# Patient Record
Sex: Female | Born: 1960 | Race: Black or African American | Hispanic: No | Smoking: Former smoker
Health system: Southern US, Community
[De-identification: ages and names within clinical notes are randomized; demographics above are authoritative.]

## PROBLEM LIST (undated history)

## (undated) DIAGNOSIS — J45909 Unspecified asthma, uncomplicated: Secondary | ICD-10-CM

## (undated) DIAGNOSIS — I10 Essential (primary) hypertension: Secondary | ICD-10-CM

## (undated) DIAGNOSIS — T7840XA Allergy, unspecified, initial encounter: Secondary | ICD-10-CM

## (undated) HISTORY — DX: Essential (primary) hypertension: I10

## (undated) HISTORY — PX: COLONOSCOPY WITH PROPOFOL: SHX5780

## (undated) HISTORY — DX: Unspecified asthma, uncomplicated: J45.909

## (undated) HISTORY — PX: CYST EXCISION: SHX5701

## (undated) HISTORY — DX: Allergy, unspecified, initial encounter: T78.40XA

---

## 2016-05-05 ENCOUNTER — Encounter: Payer: Self-pay | Admitting: Pharmacy Technician

## 2016-05-08 ENCOUNTER — Telehealth: Payer: Self-pay

## 2016-05-08 NOTE — Telephone Encounter (Signed)
Called pt and went over eligibility with her. PT verbalized understanding.

## 2016-05-26 ENCOUNTER — Encounter (INDEPENDENT_AMBULATORY_CARE_PROVIDER_SITE_OTHER): Payer: Self-pay

## 2016-05-26 ENCOUNTER — Ambulatory Visit: Payer: Self-pay | Admitting: Pharmacy Technician

## 2016-05-26 DIAGNOSIS — Z79899 Other long term (current) drug therapy: Secondary | ICD-10-CM

## 2016-05-26 NOTE — Progress Notes (Signed)
Completed [facility] application and contract.  Patient agreed to all terms of the [facility] contract.  Patient to provide notarized letter of support and letter from spouse indicating that they are separated and he is not living in the household.  Provided patient with community resource material based on her particular needs.    Patient has an appointment on Jun 19, 2016 with [facility].  She indicated that she would like to be referred to [facility].  Sending a note to [facility] to make aware.    [name] Care Manager [facility]

## 2016-06-05 ENCOUNTER — Ambulatory Visit: Payer: Self-pay

## 2016-06-10 ENCOUNTER — Ambulatory Visit: Payer: Self-pay | Admitting: Adult Health Nurse Practitioner

## 2016-06-10 VITALS — BP 135/84 | HR 92 | Temp 98.1°F | Wt 264.8 lb

## 2016-06-10 DIAGNOSIS — I10 Essential (primary) hypertension: Secondary | ICD-10-CM

## 2016-06-10 DIAGNOSIS — Z0000 Encounter for general adult medical examination without abnormal findings: Secondary | ICD-10-CM | POA: Insufficient documentation

## 2016-06-10 LAB — GLUCOSE, POCT (MANUAL RESULT ENTRY): POC GLUCOSE: 85 mg/dL (ref 70–99)

## 2016-06-10 MED ORDER — LOSARTAN POTASSIUM-HCTZ 100-25 MG PO TABS: 1.0000 | ORAL_TABLET | Freq: Every day | ORAL | 0 refills | Status: DC

## 2016-06-10 MED ORDER — ALBUTEROL SULFATE HFA 108 (90 BASE) MCG/ACT IN AERS: 2.0000 | INHALATION_SPRAY | Freq: Four times a day (QID) | RESPIRATORY_TRACT | 3 refills | Status: DC | PRN

## 2016-06-10 NOTE — Progress Notes (Signed)
  Patient: Crystal Hays Female    DOB: 04-13-1960   56 y.o.   MRN: [id number] Visit Date: 06/10/2016  Today's Provider: Jacelyn Pi, NP   Chief Complaint  Patient presents with  . Medication Refill  . Eczema   Subjective:    HPI     HTN:  Taking medication as directed.  Endorses low sodium diet, no exercise.   Pt states that she has eczema flares.      No Known Allergies Previous Medications   ALBUTEROL (PROVENTIL HFA;VENTOLIN HFA) 108 (90 BASE) MCG/ACT INHALER    Inhale 2 puffs into the lungs every 6 (six) hours as needed for wheezing or shortness of breath.   BECLOMETHASONE (QVAR) 40 MCG/ACT INHALER    Inhale 2 puffs into the lungs 2 (two) times daily.   LOSARTAN-HYDROCHLOROTHIAZIDE (HYZAAR) 100-25 MG TABLET    Take 1 tablet by mouth daily.    Review of Systems  All other systems reviewed and are negative.   Social History  Substance Use Topics  . Smoking status: Former Smoker    Types: Cigarettes    Quit date: 2004  . Smokeless tobacco: Never Used     Comment: quit  . Alcohol use Not on file   Objective:   BP 135/84   Pulse 92   Temp 98.1 F (36.7 C)   Wt 264 lb 12.8 oz (120.1 kg)   LMP  (Approximate) Comment: menopause  Physical Exam  Constitutional: She is oriented to person, place, and time. She appears well-developed and well-nourished.  HENT:  Head: Normocephalic and atraumatic.  Eyes: Pupils are equal, round, and reactive to light.  Neck: Normal range of motion. Neck supple. No thyromegaly present.  Cardiovascular: Normal rate, regular rhythm and normal heart sounds.   Pulmonary/Chest: Effort normal and breath sounds normal.  Abdominal: Soft. Bowel sounds are normal.  Neurological: She is alert and oriented to person, place, and time.  Skin: Skin is warm and dry.  Psychiatric: She has a normal mood and affect. Her behavior is normal.  Vitals reviewed.       Assessment & Plan:           Eczema:  Given some OTC remedies to help.    HTN:  Borderline.  Goal BP <140/80.  Continue current medication regimen.  Encourage low salt diet and exercise.   Routine labs.     Jacelyn Pi, NP   [facility]

## 2016-06-10 NOTE — Addendum Note (Signed)
Addended by: Smiley Houseman D on: 06/10/2016 08:26 PM   Modules accepted: Orders

## 2016-06-11 LAB — COMPREHENSIVE METABOLIC PANEL
A/G RATIO: 1.3 (ref 1.2–2.2)
ALBUMIN: 4.2 g/dL (ref 3.5–5.5)
ALK PHOS: 72 IU/L (ref 39–117)
ALT: 13 IU/L (ref 0–32)
AST: 14 IU/L (ref 0–40)
BILIRUBIN TOTAL: 0.3 mg/dL (ref 0.0–1.2)
BUN / CREAT RATIO: 29 — AB (ref 9–23)
BUN: 19 mg/dL (ref 6–24)
CO2: 28 mmol/L (ref 18–29)
Calcium: 9.5 mg/dL (ref 8.7–10.2)
Chloride: 95 mmol/L — ABNORMAL LOW (ref 96–106)
Creatinine, Ser: 0.66 mg/dL (ref 0.57–1.00)
GFR calc Af Amer: 114 mL/min/{1.73_m2} (ref >59)
GFR calc non Af Amer: 99 mL/min/{1.73_m2} (ref >59)
GLOBULIN, TOTAL: 3.3 g/dL (ref 1.5–4.5)
GLUCOSE: 98 mg/dL (ref 65–99)
POTASSIUM: 3.8 mmol/L (ref 3.5–5.2)
SODIUM: 142 mmol/L (ref 134–144)
Total Protein: 7.5 g/dL (ref 6.0–8.5)

## 2016-06-11 LAB — CBC
HEMOGLOBIN: 13.6 g/dL (ref 11.1–15.9)
Hematocrit: 40.6 % (ref 34.0–46.6)
MCH: 28.7 pg (ref 26.6–33.0)
MCHC: 33.5 g/dL (ref 31.5–35.7)
MCV: 86 fL (ref 79–97)
Platelets: 268 10*3/uL (ref 150–379)
RBC: 4.74 x10E6/uL (ref 3.77–5.28)
RDW: 14.4 % (ref 12.3–15.4)
WBC: 8.3 10*3/uL (ref 3.4–10.8)

## 2016-06-11 LAB — HEMOGLOBIN A1C
ESTIMATED AVERAGE GLUCOSE: 117 mg/dL
HEMOGLOBIN A1C: 5.7 % — AB (ref 4.8–5.6)

## 2016-06-11 LAB — TSH: TSH: 0.746 u[IU]/mL (ref 0.450–4.500)

## 2016-06-19 ENCOUNTER — Telehealth: Payer: Self-pay | Admitting: Pharmacy Technician

## 2016-06-19 NOTE — Telephone Encounter (Signed)
Received updated proof of income.  Patient eligible to receive medication assistance at [facility] until 41/19 as long as eligibility requirements continue to be met.  [facility]

## 2016-06-24 ENCOUNTER — Ambulatory Visit: Payer: Self-pay | Admitting: Adult Health Nurse Practitioner

## 2016-06-24 VITALS — BP 132/89 | HR 88 | Temp 98.2°F | Wt 266.5 lb

## 2016-06-24 DIAGNOSIS — I10 Essential (primary) hypertension: Secondary | ICD-10-CM

## 2016-06-24 DIAGNOSIS — R7303 Prediabetes: Secondary | ICD-10-CM | POA: Insufficient documentation

## 2016-06-24 NOTE — Patient Instructions (Signed)
Prediabetes Eating Plan Prediabetes-also called impaired glucose tolerance or impaired fasting glucose-is a condition that causes blood sugar (blood glucose) levels to be higher than normal. Following a healthy diet can help to keep prediabetes under control. It can also help to lower the risk of type 2 diabetes and heart disease, which are increased in people who have prediabetes. Along with regular exercise, a healthy diet:  Promotes weight loss.  Helps to control blood sugar levels.  Helps to improve the way that the body uses insulin.  What do I need to know about this eating plan?  Use the glycemic index (GI) to plan your meals. The index tells you how quickly a food will raise your blood sugar. Choose low-GI foods. These foods take a longer time to raise blood sugar.  Pay close attention to the amount of carbohydrates in the food that you eat. Carbohydrates increase blood sugar levels.  Keep track of how many calories you take in. Eating the right amount of calories will help you to achieve a healthy weight. Losing about 7 percent of your starting weight can help to prevent type 2 diabetes.  You may want to follow a Mediterranean diet. This diet includes a lot of vegetables, lean meats or fish, whole grains, fruits, and healthy oils and fats. What foods can I eat? Grains Whole grains, such as whole-wheat or whole-grain breads, crackers, cereals, and pasta. Unsweetened oatmeal. Bulgur. Barley. Quinoa. Brown rice. Corn or whole-wheat flour tortillas or taco shells. Vegetables Lettuce. Spinach. Peas. Beets. Cauliflower. Cabbage. Broccoli. Carrots. Tomatoes. Squash. Eggplant. Herbs. Peppers. Onions. Cucumbers. Brussels sprouts. Fruits Berries. Bananas. Apples. Oranges. Grapes. Papaya. Mango. Pomegranate. Kiwi. Grapefruit. Cherries. Meats and Other Protein Sources Seafood. Lean meats, such as chicken and turkey or lean cuts of pork and beef. Tofu. Eggs. Nuts. Beans. Dairy Low-fat or  fat-free dairy products, such as yogurt, cottage cheese, and cheese. Beverages Water. Tea. Coffee. Sugar-free or diet soda. Seltzer water. Milk. Milk alternatives, such as soy or almond milk. Condiments Mustard. Relish. Low-fat, low-sugar ketchup. Low-fat, low-sugar barbecue sauce. Low-fat or fat-free mayonnaise. Sweets and Desserts Sugar-free or low-fat pudding. Sugar-free or low-fat ice cream and other frozen treats. Fats and Oils Avocado. Walnuts. Olive oil. The items listed above may not be a complete list of recommended foods or beverages. Contact your dietitian for more options. What foods are not recommended? Grains Refined white flour and flour products, such as bread, pasta, snack foods, and cereals. Beverages Sweetened drinks, such as sweet iced tea and soda. Sweets and Desserts Baked goods, such as cake, cupcakes, pastries, cookies, and cheesecake. The items listed above may not be a complete list of foods and beverages to avoid. Contact your dietitian for more information. This information is not intended to replace advice given to you by your health care provider. Make sure you discuss any questions you have with your health care provider. Document Released: 06/20/2014 Document Revised: 07/12/2015 Document Reviewed: 03/01/2014 Elsevier Interactive Patient Education  2017 Elsevier Inc.  

## 2016-06-24 NOTE — Progress Notes (Signed)
  Patient: Crystal Hays Female    DOB: 1960/10/03   56 y.o.   MRN: [id number] Visit Date: 06/24/2016  Today's Provider: Jacelyn Pieah Doles-Johnson, NP   Chief Complaint  Patient presents with  . Follow-up   Subjective:    HPI  Here for lab review.      No Known Allergies Previous Medications   ALBUTEROL (PROVENTIL HFA;VENTOLIN HFA) 108 (90 BASE) MCG/ACT INHALER    Inhale 2 puffs into the lungs every 6 (six) hours as needed for wheezing or shortness of breath.   BECLOMETHASONE (QVAR) 40 MCG/ACT INHALER    Inhale 2 puffs into the lungs 2 (two) times daily.   LOSARTAN-HYDROCHLOROTHIAZIDE (HYZAAR) 100-25 MG TABLET    Take 1 tablet by mouth daily.   PREDNISONE (DELTASONE) 10 MG TABLET    Take 10 mg by mouth as needed.   TRIAMCINOLONE (KENALOG) 0.025 % CREAM    Apply 1 application topically as needed.    Review of Systems  All other systems reviewed and are negative.   Social History  Substance Use Topics  . Smoking status: Former Smoker    Types: Cigarettes    Quit date: 2004  . Smokeless tobacco: Never Used     Comment: quit  . Alcohol use Not on file   Objective:   BP 132/89   Pulse 88   Temp 98.2 F (36.8 C)   Wt 266 lb 8 oz (120.9 kg)   LMP 06/25/2014 (Approximate)   Physical Exam  Constitutional: She appears well-developed and well-nourished.  Cardiovascular: Normal rate and regular rhythm.   Pulmonary/Chest: Effort normal and breath sounds normal.  Vitals reviewed.       Assessment & Plan:     Pre-diabetes:  Encourage healthy lifestyle changes.  Given info on diet.    HTN:  Controlled.   Goal BP <140/80.  Continue current medication regimen.  Encourage low salt diet and exercise.    Check lipid panel on next ov.        Jacelyn Pieah Doles-Johnson, NP   [facility] [place]

## 2016-07-23 ENCOUNTER — Other Ambulatory Visit: Payer: Self-pay | Admitting: Adult Health Nurse Practitioner

## 2016-07-25 ENCOUNTER — Other Ambulatory Visit: Payer: Self-pay | Admitting: Internal Medicine

## 2016-09-17 ENCOUNTER — Encounter: Payer: Self-pay | Admitting: *Deleted

## 2016-09-17 ENCOUNTER — Encounter (INDEPENDENT_AMBULATORY_CARE_PROVIDER_SITE_OTHER): Payer: Self-pay

## 2016-09-17 ENCOUNTER — Ambulatory Visit
Admission: RE | Admit: 2016-09-17 | Discharge: 2016-09-17 | Disposition: A | Discharge status: Home / Self Care | Payer: Self-pay | Source: Ambulatory Visit | Attending: Oncology | Admitting: Oncology

## 2016-09-17 ENCOUNTER — Ambulatory Visit: Payer: Self-pay | Attending: Oncology | Admitting: *Deleted

## 2016-09-17 VITALS — BP 151/83 | HR 70 | Temp 98.2°F | Ht 65.0 in | Wt 266.0 lb

## 2016-09-17 DIAGNOSIS — Z0000 Encounter for general adult medical examination without abnormal findings: Secondary | ICD-10-CM

## 2016-09-17 NOTE — Progress Notes (Signed)
Subjective:     Patient ID: Crystal Hays, female   DOB: 03-22-1960, 56 y.o.   MRN: [id number]  HPI   Review of Systems     Objective:   Physical Exam  Pulmonary/Chest: Right breast exhibits no inverted nipple, no mass, no nipple discharge, no skin change and no tenderness. Left breast exhibits no inverted nipple, no mass, no nipple discharge, no skin change and no tenderness. Breasts are symmetrical.  Very large pendulous breast  Abdominal: There is no splenomegaly or hepatomegaly.  Genitourinary: No labial fusion. There is no rash, tenderness, lesion or injury on the right labia. There is no rash, tenderness, lesion or injury on the left labia. Uterus is not deviated, not enlarged and not fixed. Cervix exhibits no motion tenderness, no discharge and no friability. Right adnexum displays no mass, no tenderness and no fullness. Left adnexum displays no mass, no tenderness and no fullness. No erythema, tenderness or bleeding in the vagina. No foreign body in the vagina. No signs of injury around the vagina. Vaginal discharge found.    Genitourinary Comments: White non-odorous discharge noted       Assessment:     56 year old Black [facility] pap and mamm[facility].  Clinical breast exam unremarkable.  Taught self breast awareness.  Specimen collected for pap smear without difficulty.  Patient has been screened for eligibility.  She does not have any insurance, Medicare or Medicaid.  She also meets financial eligibility.  Hand-out given on the Affordable Care Act.    Plan:     Screening mammogram ordered.  Specimen for pap sent to the lab.  Will follow-up per [facility] protocol.

## 2016-09-17 NOTE — Patient Instructions (Signed)
HPV Test The human papillomavirus (HPV) test is used to look for high-risk types of HPV infection. HPV is a group of about 100 viruses. Many of these viruses cause growths on, in, or around the genitals. Most HPV viruses cause infections that usually go away without treatment. However, HPV types 6, 11, 16, and 18 are considered high-risk types of HPV that can increase your risk of cancer of the cervix or anus if the infection is left untreated. An HPV test identifies the DNA (genetic) strands of the HPV infection, so it is also referred to as the HPV DNA test. Although HPV is found in both males and females, the HPV test is only used to screen for increased cancer risk in females:  With an abnormal Pap test.  After treatment of an abnormal Pap test.  Between the ages of 30 and 65.  After treatment of a high-risk HPV infection. The HPV test may be done at the same time as a pelvic exam and Pap test in females over the age of 30. Both the HPV test and Pap test require a sample of cells from the cervix. How do I prepare for this test?  Do not douche or take a bath for 24-48 hours before the test or as directed by your health care provider.  Do not have sex for 24-48 hours before the test or as directed by your health care provider.  You may be asked to reschedule the test if you are menstruating.  You will be asked to urinate before the test. What do the results mean? It is your responsibility to obtain your test results. Ask the lab or department performing the test when and how you will get your results. Talk with your health care provider if you have any questions about your results. Your result will be negative or positive. Meaning of Negative Test Results  A negative HPV test result means that no HPV was found, and it is very likely that you do not have HPV. Meaning of Positive Test Results  A positive HPV test result indicates that you have HPV.  If your test result shows the presence  of any high-risk HPV strains, you may have an increased risk of developing cancer of the cervix or anus if the infection is left untreated.  If any low-risk HPV strains are found, you are not likely to have an increased risk of cancer. Discuss your test results with your health care provider. He or she will use the results to make a diagnosis and determine a treatment plan that is right for you. Talk with your health care provider to discuss your results, treatment options, and if necessary, the need for more tests. Talk with your health care provider if you have any questions about your results. This information is not intended to replace advice given to you by your health care provider. Make sure you discuss any questions you have with your health care provider. Document Released: 02/29/2004 Document Revised: 10/10/2015 Document Reviewed: 06/21/2013 Elsevier Interactive Patient Education  2017 Elsevier Inc.  Gave patient hand-out, Women Staying Healthy, Active and Well from [facility], with education on breast health, pap smears, heart and colon health.  

## 2016-09-24 LAB — PAP LB AND HPV HIGH-RISK
HPV, high-risk: POSITIVE — AB
PAP Smear Comment: 0

## 2016-09-25 ENCOUNTER — Telehealth: Payer: Self-pay | Admitting: *Deleted

## 2016-09-25 ENCOUNTER — Inpatient Hospital Stay
Admission: RE | Admit: 2016-09-25 | Discharge: 2016-09-25 | Disposition: A | Discharge status: Home / Self Care | Payer: Self-pay | Source: Ambulatory Visit | Attending: *Deleted | Admitting: *Deleted

## 2016-09-25 ENCOUNTER — Other Ambulatory Visit: Payer: Self-pay | Admitting: *Deleted

## 2016-09-25 DIAGNOSIS — Z9289 Personal history of other medical treatment: Secondary | ICD-10-CM

## 2016-09-25 NOTE — Telephone Encounter (Signed)
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  Discussed results with patient and need for treatment.  Prescription for Metronidazole 2 grams or 4 tablets by mouth once, called to [facility] on [address] per [facility].  She was encouraged not to drink any alcohol with this med.  Informed I would also mail her a letter with her next pap appointment in one year.

## 2016-09-26 ENCOUNTER — Encounter: Payer: Self-pay | Admitting: *Deleted

## 2016-09-26 NOTE — Progress Notes (Signed)
Letter mailed to inform patient of her normal mammogram and next appointment for her annual exam and pap smear on 09/23/17 @ 10:00.  [facility] [place].

## 2016-10-14 ENCOUNTER — Ambulatory Visit: Payer: Self-pay

## 2016-12-16 ENCOUNTER — Other Ambulatory Visit: Payer: Self-pay

## 2016-12-23 ENCOUNTER — Ambulatory Visit: Payer: Self-pay

## 2016-12-23 ENCOUNTER — Ambulatory Visit: Payer: Self-pay | Admitting: Adult Health Nurse Practitioner

## 2016-12-23 VITALS — BP 120/82 | HR 78 | Temp 97.7°F | Wt 258.0 lb

## 2016-12-23 DIAGNOSIS — R7303 Prediabetes: Secondary | ICD-10-CM

## 2016-12-23 DIAGNOSIS — G8929 Other chronic pain: Secondary | ICD-10-CM

## 2016-12-23 DIAGNOSIS — M25561 Pain in right knee: Secondary | ICD-10-CM | POA: Insufficient documentation

## 2016-12-23 DIAGNOSIS — I10 Essential (primary) hypertension: Secondary | ICD-10-CM

## 2016-12-23 MED ORDER — MELOXICAM 7.5 MG PO TABS: 7.5000 mg | ORAL_TABLET | Freq: Every evening | ORAL | 0 refills | Status: DC | PRN

## 2016-12-23 NOTE — Progress Notes (Signed)
  Patient: Crystal Hays Female    DOB: Nov 19, 1960   56 y.o.   MRN: [id number] Visit Date: 12/23/2016  Today's Provider: Jacelyn Pieah Doles-Johnson, NP   Chief Complaint  Patient presents with  . Follow-up    R knee pain , intermittent (fell in the spring), working on # and glucose   Subjective:    HPI   Pt states her BP has been running ok at home.  Denies Ha, dizziness, CP.  Pt states that she has been following a low carb diet.  Pt states that she is walking occasionally.  Last A1c was 5.7.  Down 8 lbs   Pt states that she fell in the spring and injured her knee.  Pt states ibuprofen and elevation at night help- sometimes still stiff.  She states that it hurts when she bends it- no pain with walking.       No Known Allergies This SmartLink is deprecated. Use AVSMEDLIST instead to display the medication list for a patient.  Review of Systems  All other systems reviewed and are negative.   Social History   Tobacco Use  . Smoking status: Former Smoker    Types: Cigarettes    Last attempt to quit: 2004    Years since quitting: 14.8  . Smokeless tobacco: Never Used  . Tobacco comment: quit  Substance Use Topics  . Alcohol use: Not on file   Objective:   BP 120/82   Pulse 78   Temp 97.7 F (36.5 C)   Wt 258 lb (117 kg)   LMP 06/25/2014 (Approximate)   BMI 42.93 kg/m   Physical Exam  Constitutional: She appears well-developed and well-nourished.  HENT:  Head: Normocephalic and atraumatic.  Eyes: Pupils are equal, round, and reactive to light.  Neck: Normal range of motion. Neck supple.  Cardiovascular: Normal rate, regular rhythm and normal heart sounds.  Pulmonary/Chest: Effort normal and breath sounds normal.  Abdominal: Soft. Bowel sounds are normal.  Musculoskeletal:       Right knee: She exhibits normal range of motion, no swelling, no deformity and no erythema. No tenderness found.  Vitals reviewed.       Assessment & Plan:         Pre-DM:  A1C  today.  Encourage diet modification and exercise.   Knee pain:  R knee xray.  Tiger balm-continue to use for knee pain.    HTN:  Controlled.  Goal BP <140/90.  Continue current medication regimen.  Encourage low salt diet and exercise.    Jacelyn Pieah Doles-Johnson, NP   [facility] [place]

## 2016-12-24 LAB — SPECIMEN STATUS

## 2016-12-25 LAB — LIPID PANEL
Chol/HDL Ratio: 2.7 ratio (ref 0.0–4.4)
Cholesterol, Total: 192 mg/dL (ref 100–199)
HDL: 72 mg/dL (ref >39)
LDL Calculated: 102 mg/dL — ABNORMAL HIGH (ref 0–99)
TRIGLYCERIDES: 89 mg/dL (ref 0–149)
VLDL Cholesterol Cal: 18 mg/dL (ref 5–40)

## 2016-12-26 LAB — HGB A1C W/O EAG: HEMOGLOBIN A1C: 5.8 % — AB (ref 4.8–5.6)

## 2016-12-26 LAB — SPECIMEN STATUS REPORT

## 2017-02-22 ENCOUNTER — Other Ambulatory Visit: Payer: Self-pay

## 2017-02-22 ENCOUNTER — Emergency Department: Payer: BLUE CROSS/BLUE SHIELD

## 2017-02-22 ENCOUNTER — Encounter: Payer: Self-pay | Admitting: *Deleted

## 2017-02-22 ENCOUNTER — Emergency Department
Admission: EM | Admit: 2017-02-22 | Discharge: 2017-02-22 | Disposition: A | Discharge status: Home / Self Care | Payer: BLUE CROSS/BLUE SHIELD | Attending: Emergency Medicine | Admitting: Emergency Medicine

## 2017-02-22 DIAGNOSIS — J45909 Unspecified asthma, uncomplicated: Secondary | ICD-10-CM | POA: Diagnosis not present

## 2017-02-22 DIAGNOSIS — Z87891 Personal history of nicotine dependence: Secondary | ICD-10-CM | POA: Diagnosis not present

## 2017-02-22 DIAGNOSIS — I10 Essential (primary) hypertension: Secondary | ICD-10-CM | POA: Insufficient documentation

## 2017-02-22 DIAGNOSIS — J029 Acute pharyngitis, unspecified: Secondary | ICD-10-CM | POA: Diagnosis present

## 2017-02-22 DIAGNOSIS — R0981 Nasal congestion: Secondary | ICD-10-CM | POA: Insufficient documentation

## 2017-02-22 DIAGNOSIS — J039 Acute tonsillitis, unspecified: Secondary | ICD-10-CM | POA: Diagnosis not present

## 2017-02-22 DIAGNOSIS — Z79899 Other long term (current) drug therapy: Secondary | ICD-10-CM | POA: Diagnosis not present

## 2017-02-22 DIAGNOSIS — R05 Cough: Secondary | ICD-10-CM | POA: Diagnosis not present

## 2017-02-22 DIAGNOSIS — J3489 Other specified disorders of nose and nasal sinuses: Secondary | ICD-10-CM | POA: Diagnosis not present

## 2017-02-22 MED ORDER — IPRATROPIUM-ALBUTEROL 0.5-2.5 (3) MG/3ML IN SOLN
3.0000 mL | Freq: Once | RESPIRATORY_TRACT | Status: AC
  Administered 2017-02-22: 3 mL via RESPIRATORY_TRACT
  Filled 2017-02-22: qty 3

## 2017-02-22 MED ORDER — LIDOCAINE VISCOUS 2 % MT SOLN
15.0000 mL | Freq: Once | OROMUCOSAL | Status: AC
  Administered 2017-02-22: 15 mL via OROMUCOSAL
  Filled 2017-02-22: qty 15

## 2017-02-22 MED ORDER — PREDNISONE 10 MG PO TABS: ORAL_TABLET | ORAL | 0 refills | Status: DC

## 2017-02-22 MED ORDER — ALBUTEROL SULFATE (2.5 MG/3ML) 0.083% IN NEBU: 2.5000 mg | INHALATION_SOLUTION | Freq: Four times a day (QID) | RESPIRATORY_TRACT | 12 refills | Status: DC | PRN

## 2017-02-22 MED ORDER — PENICILLIN V POTASSIUM 500 MG PO TABS
500.0000 mg | ORAL_TABLET | Freq: Once | ORAL | Status: AC
  Administered 2017-02-22: 500 mg via ORAL
  Filled 2017-02-22: qty 1

## 2017-02-22 MED ORDER — DEXAMETHASONE SODIUM PHOSPHATE 10 MG/ML IJ SOLN
10.0000 mg | Freq: Once | INTRAMUSCULAR | Status: AC
  Administered 2017-02-22: 10 mg via INTRAMUSCULAR
  Filled 2017-02-22: qty 1

## 2017-02-22 MED ORDER — PENICILLIN V POTASSIUM 500 MG PO TABS: 500.0000 mg | ORAL_TABLET | Freq: Three times a day (TID) | ORAL | 0 refills | Status: AC

## 2017-02-22 MED ORDER — LIDOCAINE VISCOUS 2 % MT SOLN: 10.0000 mL | OROMUCOSAL | 0 refills | Status: DC | PRN

## 2017-02-22 MED ORDER — OXYCODONE-ACETAMINOPHEN 5-325 MG PO TABS
1.0000 | ORAL_TABLET | Freq: Once | ORAL | Status: AC
  Administered 2017-02-22: 1 via ORAL
  Filled 2017-02-22: qty 1

## 2017-02-22 NOTE — ED Provider Notes (Signed)
[facility] Emergency Department Provider Note  ____________________________________________  Time seen: Approximately 3:20 PM  I have reviewed the triage vital signs and the nursing notes.   HISTORY  Chief Complaint Sore Throat    HPI Crystal Hays is a 57 y.o. female that presents to the emergency department for evaluation of sore throat for 2 days.  Patient states that she is coughing up postnasal drainage.  [facility] and was referred to the emergency department for evaluation of pneumonia and tonsillitis.  She is not allergic to any medications.  She has not smoked for 15 years.  She denies fever, cough, shortness of breath, chest pain, nausea, vomiting, abdominal pain.  Past Medical History:  Diagnosis Date  . Allergy   . Asthma   . Hypertension     Patient Active Problem List   Diagnosis Date Noted  . Right knee pain 12/23/2016  . Prediabetes 06/24/2016  . Hypertension 06/10/2016  . Healthcare maintenance 06/10/2016    Past Surgical History:  Procedure Laterality Date  . CESAREAN SECTION    . CYST EXCISION      Prior to Admission medications   Medication Sig Start Date End Date Taking? Authorizing Provider  albuterol (PROVENTIL HFA;VENTOLIN HFA) 108 (90 Base) MCG/ACT inhaler Inhale 2 puffs into the lungs every 6 (six) hours as needed for wheezing or shortness of breath. 06/10/16   Doles-Johnson, Teah, NP  beclomethasone (QVAR) 40 MCG/ACT inhaler Inhale 2 puffs into the lungs 2 (two) times daily.    [provider]  hydrochlorothiazide (HYDRODIURIL) 25 MG tablet TAKE ONE TABLET BY MOUTH EVERY DAY. TAKE WITH LOSARTAN. 07/28/16   Virl Axe, MD  lidocaine (XYLOCAINE) 2 % solution Use as directed 10 mLs in the mouth or throat as needed for mouth pain. 02/22/17   Enid Derry, PA-C  losartan (COZAAR) 100 MG tablet TAKE ONE TABLET BY MOUTH EVERY DAY. TAKE WITH HCTZ. 07/28/16   Virl Axe, MD   meloxicam (MOBIC) 7.5 MG tablet Take 1 tablet (7.5 mg total) at bedtime as needed by mouth for pain. 12/23/16   Doles-Johnson, Teah, NP  penicillin v potassium (VEETID) 500 MG tablet Take 1 tablet (500 mg total) by mouth 3 (three) times daily for 10 days. 02/22/17 03/04/17  Enid Derry, PA-C  predniSONE (DELTASONE) 10 MG tablet Take 6 tablets on day 1, take 5 tablets on day 2, take 4 tablets on day 3, take 3 tablets on day 4, take 2 tablets on day 5, take 1 tablet on day 6 02/22/17   Enid Derry, PA-C  triamcinolone (KENALOG) 0.025 % cream Apply 1 application topically as needed.    [provider]    Allergies Patient has no known allergies.  Family History  Problem Relation Age of Onset  . Heart disease Father   . Prostate cancer Father   . Diabetes Maternal Aunt     Social History Social History   Tobacco Use  . Smoking status: Former Smoker    Types: Cigarettes    Last attempt to quit: 2004    Years since quitting: 15.0  . Smokeless tobacco: Never Used  . Tobacco comment: quit  Substance Use Topics  . Alcohol use: Yes    Frequency: Never    Comment: occasionally  . Drug use: No     Review of Systems  Constitutional: No fever/chills Eyes: No visual changes. No discharge. ENT: Positive for congestion and rhinorrhea. Cardiovascular: No chest pain. Respiratory:  No SOB. Gastrointestinal: No abdominal pain.  No nausea, no vomiting.  No diarrhea.  No constipation. Musculoskeletal: Negative for musculoskeletal pain. Skin: Negative for rash, abrasions, lacerations, ecchymosis. Neurological: Negative for headaches.   ____________________________________________   PHYSICAL EXAM:  VITAL SIGNS: ED Triage Vitals  Enc Vitals Group     BP 02/22/17 1258 131/65     Pulse Rate 02/22/17 1258 89     Resp 02/22/17 1258 16     Temp 02/22/17 1258 98.5 F (36.9 C)     Temp Source 02/22/17 1258 Oral     SpO2 02/22/17 1258 94 %     Weight 02/22/17 1259 262 lb (118.8  kg)     Height 02/22/17 1259 5\' 4"  (1.626 m)     Head Circumference --      Peak Flow --      Pain Score 02/22/17 1309 7     Pain Loc --      Pain Edu? --      Excl. in GC? --      Constitutional: Alert and oriented. Well appearing and in no acute distress. Eyes: Conjunctivae are normal. PERRL. EOMI. No discharge. Head: Atraumatic. ENT: No frontal and maxillary sinus tenderness.      Ears: Tympanic membranes pearly gray with good landmarks. No discharge.      Nose: Mild congestion/rhinnorhea.      Mouth/Throat: Mucous membranes are moist. Oropharynx erythematous. Tonsils not enlarged. Exudates bilaterally. Uvula midline. Neck: No stridor.   Hematological/Lymphatic/Immunilogical: No cervical lymphadenopathy. Cardiovascular: Normal rate, regular rhythm.  Good peripheral circulation. Respiratory: Normal respiratory effort without tachypnea or retractions. Lungs CTAB. Good air entry to the bases with no decreased or absent breath sounds. Gastrointestinal: Bowel sounds 4 quadrants. Soft and nontender to palpation. No guarding or rigidity. No palpable masses. No distention. Musculoskeletal: Full range of motion to all extremities. No gross deformities appreciated. Neurologic:  Normal speech and language. No gross focal neurologic deficits are appreciated.  Skin:  Skin is warm, dry and intact. No rash noted.      ____________________________________________   LABS (all labs ordered are listed, but only abnormal results are displayed)  Labs Reviewed - No data to display ____________________________________________  EKG   ____________________________________________  RADIOLOGY Lexine BatonI, Irish Breisch, personally viewed and evaluated these images (plain radiographs) as part of my medical decision making, as well as reviewing the written report by the radiologist.  Dg Chest 2 View  Result Date: 02/22/2017 CLINICAL DATA:  57 year old female with sore throat and productive cough EXAM:  CHEST  2 VIEW COMPARISON:  None. FINDINGS: The lungs are clear and negative for focal airspace consolidation, pulmonary edema or suspicious pulmonary nodule. No pleural effusion or pneumothorax. Cardiac and mediastinal contours are within normal limits. No acute fracture or lytic or blastic osseous lesions. The visualized upper abdominal bowel gas pattern is unremarkable. IMPRESSION: Negative chest x-ray. Electronically Signed   By: Malachy MoanHeath  McCullough M.D.   On: 02/22/2017 14:32    ____________________________________________    PROCEDURES  Procedure(s) performed:    Procedures    Medications  ipratropium-albuterol (DUONEB) 0.5-2.5 (3) MG/3ML nebulizer solution 3 mL (not administered)  dexamethasone (DECADRON) injection 10 mg (not administered)  oxyCODONE-acetaminophen (PERCOCET/ROXICET) 5-325 MG per tablet 1 tablet (not administered)  lidocaine (XYLOCAINE) 2 % viscous mouth solution 15 mL (not administered)  penicillin v potassium (VEETID) tablet 500 mg (not administered)     ____________________________________________   INITIAL IMPRESSION / ASSESSMENT AND PLAN / ED COURSE  Pertinent labs & imaging  results that were available during my care of the patient were reviewed by me and considered in my medical decision making (see chart for details).  Review of the [place] CSRS was performed in accordance of the [facility] prior to dispensing any controlled drugs.   Patient's diagnosis is consistent with tonsillitis. Vital signs and exam are reassuring. Chest xray negative for acute cardiopulmonary processes.  Patient was given DuoNeb, IM Decadron, viscous lidocaine, penicillin, and Percocet for symptoms.  Patient appears well and is staying well hydrated. Patient feels comfortable going home. Patient will be discharged home with prescriptions for penicillin, prednisone and viscous lidocaine. Patient is to follow up with PCP as needed or otherwise directed. Patient is given ED precautions to return  to the ED for any worsening or new symptoms.     ____________________________________________  FINAL CLINICAL IMPRESSION(S) / ED DIAGNOSES  Final diagnoses:  Tonsillitis      NEW MEDICATIONS STARTED DURING THIS VISIT:  ED Discharge Orders        Ordered    penicillin v potassium (VEETID) 500 MG tablet  3 times daily     02/22/17 1524    predniSONE (DELTASONE) 10 MG tablet     02/22/17 1524    lidocaine (XYLOCAINE) 2 % solution  As needed     02/22/17 1524          This chart was dictated using voice recognition software/Dragon. Despite best efforts to proofread, errors can occur which can change the meaning. Any change was purely unintentional.    Enid Derry, PA-C 02/22/17 Tollie Eth, MD 02/26/17 [phone]

## 2017-02-22 NOTE — ED Triage Notes (Signed)
[facility] for possible pneumonia and tonsillitis. Patient c/o sore throat, enlarged lymph nodes and has a yellow-tinged mucous. No crackles were heard in triage, but patient does have an expiratory wheeze. Patient denies shortness of breath.

## 2017-02-22 NOTE — ED Notes (Signed)

## 2017-06-23 ENCOUNTER — Ambulatory Visit: Payer: Self-pay | Admitting: Adult Health Nurse Practitioner

## 2017-06-23 VITALS — BP 117/82 | HR 72 | Temp 98.2°F | Wt 255.8 lb

## 2017-06-23 DIAGNOSIS — R7303 Prediabetes: Secondary | ICD-10-CM

## 2017-06-23 DIAGNOSIS — I10 Essential (primary) hypertension: Secondary | ICD-10-CM

## 2017-06-23 MED ORDER — HYDROCHLOROTHIAZIDE 25 MG PO TABS: ORAL_TABLET | ORAL | 6 refills | Status: DC

## 2017-06-23 MED ORDER — LOSARTAN POTASSIUM 100 MG PO TABS: ORAL_TABLET | ORAL | 6 refills | Status: DC

## 2017-06-23 NOTE — Progress Notes (Signed)
  Patient: Crystal Hays Female    DOB: 09-25-1960   57 y.o.   MRN: [id number] Visit Date: 06/23/2017  Today's Provider: Jacelyn Pi, NP   Chief Complaint  Patient presents with  . Annual Exam   Subjective:    HPI   Taking BP medications as directed.  States that her R knee pain improved.      No Known Allergies Previous Medications   ALBUTEROL (PROVENTIL HFA;VENTOLIN HFA) 108 (90 BASE) MCG/ACT INHALER    Inhale 2 puffs into the lungs every 6 (six) hours as needed for wheezing or shortness of breath.   ALBUTEROL (PROVENTIL) (2.5 MG/3ML) 0.083% NEBULIZER SOLUTION    Take 3 mLs (2.5 mg total) by nebulization every 6 (six) hours as needed for wheezing or shortness of breath.   BECLOMETHASONE (QVAR) 40 MCG/ACT INHALER    Inhale 2 puffs into the lungs 2 (two) times daily.   HYDROCHLOROTHIAZIDE (HYDRODIURIL) 25 MG TABLET    TAKE ONE TABLET BY MOUTH EVERY DAY. TAKE WITH LOSARTAN.   LOSARTAN (COZAAR) 100 MG TABLET    TAKE ONE TABLET BY MOUTH EVERY DAY. TAKE WITH HCTZ.   TRIAMCINOLONE (KENALOG) 0.025 % CREAM    Apply 1 application topically as needed.    Review of Systems  All other systems reviewed and are negative.   Social History   Tobacco Use  . Smoking status: Former Smoker    Types: Cigarettes    Last attempt to quit: 2004    Years since quitting: 15.3  . Smokeless tobacco: Never Used  . Tobacco comment: quit  Substance Use Topics  . Alcohol use: Yes    Frequency: Never    Comment: occasionally   Objective:   BP 117/82   Pulse 72   Temp 98.2 F (36.8 C)   Wt 255 lb 12.8 oz (116 kg)   LMP 06/25/2014 (Approximate)   BMI 43.91 kg/m   Physical Exam  Constitutional: She is oriented to person, place, and time. She appears well-developed and well-nourished.  HENT:  Head: Normocephalic and atraumatic.  Eyes: Pupils are equal, round, and reactive to light.  Neck: Normal range of motion. Neck supple.  Cardiovascular: Normal rate, regular rhythm, normal heart  sounds and intact distal pulses.  Pulmonary/Chest: Effort normal and breath sounds normal.  Abdominal: Soft. Bowel sounds are normal.  Musculoskeletal: Normal range of motion.  Lymphadenopathy:    She has no cervical adenopathy.  Neurological: She is alert and oriented to person, place, and time.  Skin: Skin is warm and dry.  Vitals reviewed.       Assessment & Plan:         HTN:  Controlled.  Goal BP <140/90.  Continue current medication regimen.  Encourage low salt diet and exercise.   Pre-DM:  Signed up for pre-Dm classes.  Encouraged continued exercise and diet modifications.  Down 3 lbs this visit.    Routine labs ordered today.     Jacelyn Pi, NP   [facility]

## 2017-06-24 LAB — COMPREHENSIVE METABOLIC PANEL
ALBUMIN: 4 g/dL (ref 3.5–5.5)
ALK PHOS: 65 IU/L (ref 39–117)
ALT: 18 IU/L (ref 0–32)
AST: 17 IU/L (ref 0–40)
Albumin/Globulin Ratio: 1.3 (ref 1.2–2.2)
BUN / CREAT RATIO: 23 (ref 9–23)
BUN: 16 mg/dL (ref 6–24)
CALCIUM: 9.2 mg/dL (ref 8.7–10.2)
CHLORIDE: 99 mmol/L (ref 96–106)
CO2: 28 mmol/L (ref 20–29)
Creatinine, Ser: 0.71 mg/dL (ref 0.57–1.00)
GFR calc non Af Amer: 95 mL/min/{1.73_m2} (ref >59)
GFR, EST AFRICAN AMERICAN: 109 mL/min/{1.73_m2} (ref >59)
GLOBULIN, TOTAL: 3.1 g/dL (ref 1.5–4.5)
Glucose: 88 mg/dL (ref 65–99)
Potassium: 3.9 mmol/L (ref 3.5–5.2)
SODIUM: 142 mmol/L (ref 134–144)
TOTAL PROTEIN: 7.1 g/dL (ref 6.0–8.5)

## 2017-06-24 LAB — CBC
HEMATOCRIT: 39.2 % (ref 34.0–46.6)
Hemoglobin: 12.7 g/dL (ref 11.1–15.9)
MCH: 27.4 pg (ref 26.6–33.0)
MCHC: 32.4 g/dL (ref 31.5–35.7)
MCV: 85 fL (ref 79–97)
PLATELETS: 271 10*3/uL (ref 150–379)
RBC: 4.63 x10E6/uL (ref 3.77–5.28)
RDW: 13.9 % (ref 12.3–15.4)
WBC: 6.2 10*3/uL (ref 3.4–10.8)

## 2017-06-24 LAB — TSH: TSH: 0.833 u[IU]/mL (ref 0.450–4.500)

## 2017-06-24 LAB — HEMOGLOBIN A1C
Est. average glucose Bld gHb Est-mCnc: 114 mg/dL
HEMOGLOBIN A1C: 5.6 % (ref 4.8–5.6)

## 2017-07-15 ENCOUNTER — Ambulatory Visit: Payer: Self-pay | Admitting: Ophthalmology

## 2017-07-29 ENCOUNTER — Ambulatory Visit: Payer: Self-pay | Admitting: Ophthalmology

## 2017-08-12 ENCOUNTER — Encounter: Payer: Self-pay | Admitting: Ophthalmology

## 2017-08-12 ENCOUNTER — Ambulatory Visit: Payer: Self-pay | Admitting: Ophthalmology

## 2017-09-23 ENCOUNTER — Ambulatory Visit: Payer: Self-pay

## 2017-11-11 ENCOUNTER — Encounter (INDEPENDENT_AMBULATORY_CARE_PROVIDER_SITE_OTHER): Payer: Self-pay

## 2017-11-11 ENCOUNTER — Encounter: Payer: Self-pay | Admitting: *Deleted

## 2017-11-11 ENCOUNTER — Other Ambulatory Visit: Payer: Self-pay

## 2017-11-11 ENCOUNTER — Ambulatory Visit
Admission: RE | Admit: 2017-11-11 | Discharge: 2017-11-11 | Disposition: A | Discharge status: Home / Self Care | Payer: Self-pay | Source: Ambulatory Visit | Attending: Oncology | Admitting: Oncology

## 2017-11-11 ENCOUNTER — Other Ambulatory Visit: Payer: Self-pay | Admitting: Oncology

## 2017-11-11 ENCOUNTER — Ambulatory Visit: Payer: Self-pay | Attending: Oncology | Admitting: *Deleted

## 2017-11-11 VITALS — BP 135/83 | HR 79 | Temp 97.8°F | Ht 66.0 in | Wt 254.0 lb

## 2017-11-11 DIAGNOSIS — Z0000 Encounter for general adult medical examination without abnormal findings: Secondary | ICD-10-CM

## 2017-11-11 NOTE — Patient Instructions (Signed)
HPV Test The human papillomavirus (HPV) test is used to look for high-risk types of HPV infection. HPV is a group of about 100 viruses. Many of these viruses cause growths on, in, or around the genitals. Most HPV viruses cause infections that usually go away without treatment. However, HPV types 6, 11, 16, and 18 are considered high-risk types of HPV that can increase your risk of cancer of the cervix or anus if the infection is left untreated. An HPV test identifies the DNA (genetic) strands of the HPV infection, so it is also referred to as the HPV DNA test. Although HPV is found in both males and females, the HPV test is only used to screen for increased cancer risk in females:  With an abnormal Pap test.  After treatment of an abnormal Pap test.  Between the ages of 30 and 65.  After treatment of a high-risk HPV infection.  The HPV test may be done at the same time as a pelvic exam and Pap test in females over the age of 30. Both the HPV test and Pap test require a sample of cells from the cervix. How do I prepare for this test?  Do not douche or take a bath for 24-48 hours before the test or as directed by your health care provider.  Do not have sex for 24-48 hours before the test or as directed by your health care provider.  You may be asked to reschedule the test if you are menstruating.  You will be asked to urinate before the test. What do the results mean? It is your responsibility to obtain your test results. Ask the lab or department performing the test when and how you will get your results. Talk with your health care provider if you have any questions about your results. Your result will be negative or positive. Meaning of Negative Test Results A negative HPV test result means that no HPV was found, and it is very likely that you do not have HPV. Meaning of Positive Test Results A positive HPV test result indicates that you have HPV.  If your test result shows the presence  of any high-risk HPV strains, you may have an increased risk of developing cancer of the cervix or anus if the infection is left untreated.  If any low-risk HPV strains are found, you are not likely to have an increased risk of cancer.  Discuss your test results with your health care provider. He or she will use the results to make a diagnosis and determine a treatment plan that is right for you. Talk with your health care provider to discuss your results, treatment options, and if necessary, the need for more tests. Talk with your health care provider if you have any questions about your results. This information is not intended to replace advice given to you by your health care provider. Make sure you discuss any questions you have with your health care provider. Document Released: 02/29/2004 Document Revised: 10/10/2015 Document Reviewed: 06/21/2013 Elsevier Interactive Patient Education  2018 Elsevier Inc.   Gave patient hand-out, Women Staying Healthy, Active and Well from [facility], with education on breast health, pap smears, heart and colon health.  

## 2017-11-11 NOTE — Progress Notes (Signed)
  Subjective:     Patient ID: Crystal Hays, female   DOB: 09/05/1960, 57 y.o.   MRN: [id number]  HPI   Review of Systems     Objective:   Physical Exam  Pulmonary/Chest: Right breast exhibits no inverted nipple, no mass, no nipple discharge, no skin change and no tenderness. Left breast exhibits no inverted nipple, no mass, no nipple discharge, no skin change and no tenderness.  Large pendulous breast  Abdominal: There is no splenomegaly or hepatomegaly.  Genitourinary: No labial fusion. There is no rash, tenderness, lesion or injury on the right labia. There is no rash, tenderness, lesion or injury on the left labia.  Genitourinary Comments: Stenosed os       Assessment:     57 [facility]  Clinical breast exam unremarkable.  Taught self breast awareness.  Last pap on 09/17/16 was negative/HPV+.  Specimen collected for pap smear without difficulty.  Patient has been screened for eligibility.  She does not have any insurance, Medicare or Medicaid.  She also meets financial eligibility.  Hand-out given on the Affordable Care Act. Risk Assessment    Risk Scores      11/11/2017   Last edited by: Scarlett PrestoShaver, Anne F, RN   5-year risk: 1.4 %   Lifetime risk: 7.5 %            Plan:      Screening mammogram ordered.  Specimen for pap sent to the lab.  Will follow-up per [facility] protocol.

## 2017-11-17 LAB — PAP LB AND HPV HIGH-RISK
HPV, high-risk: NEGATIVE
PAP Smear Comment: 0

## 2017-11-19 ENCOUNTER — Encounter: Payer: Self-pay | Admitting: *Deleted

## 2017-11-19 NOTE — Progress Notes (Signed)
Letter mailed to inform patient of her mammogram and pap smear.  Next mammo due in 1 year and pap smear in 3 years per [facility] guidelines.  HSIS to [place].

## 2017-12-10 ENCOUNTER — Telehealth: Payer: Self-pay | Admitting: Pharmacy Technician

## 2017-12-10 NOTE — Telephone Encounter (Signed)
Patient failed to provide 2019 poi.  No additional medication assistance will be provided by [facility] without the required proof of income documentation.  Patient notified by letter.  Allyne Hebert J. Ethelle Ola Care Manager [facility] 

## 2017-12-29 ENCOUNTER — Ambulatory Visit: Payer: Self-pay | Admitting: Family Medicine

## 2017-12-29 VITALS — BP 140/81 | HR 80 | Temp 97.9°F | Ht 64.0 in | Wt 259.9 lb

## 2017-12-29 DIAGNOSIS — I10 Essential (primary) hypertension: Secondary | ICD-10-CM

## 2017-12-29 DIAGNOSIS — Z5181 Encounter for therapeutic drug level monitoring: Secondary | ICD-10-CM

## 2017-12-29 DIAGNOSIS — R7303 Prediabetes: Secondary | ICD-10-CM

## 2017-12-29 NOTE — Progress Notes (Signed)
BP 140/81   Pulse 80   Temp 97.9 F (36.6 C)   Ht 5' 4" (1.626 m)   Wt 259 lb 14.4 oz (117.9 kg)   LMP 06/25/2014 (Approximate)   BMI 44.61 kg/m    Subjective:    Patient ID: Crystal Hays, female    DOB: Jun 16, 1960, 57 y.o.   MRN: [id number]  HPI: Crystal Hays is a 57 y.o. female  Chief Complaint  Patient presents with  . Follow-up   HPI Patient is here for f/u She was seen in May for essential HTN; BP then was 117/82 Low salt diet was encouraged  Prediabetes noted; exercise and dietary modifications recommended A1c was 5.8 one year ago, down to 5.6 last check in May She does not want to get on medicines She wants to learn how to cook better Mentally she can do that on her own  She loves breakfast but steered away from the bread and sausage and doing smoothies, some chocolates and some veggies; pineapple to sweeten; does the lactose free milk; loves it but gives her inflammation  CMP and CBC were normal  Morbid obesity;she needs to drink more water; she has plenty of water in her room  No flowsheet data found. No flowsheet data found.  Relevant past medical, surgical, family and social history reviewed Past Medical History:  Diagnosis Date  . Allergy   . Asthma   . Hypertension    Past Surgical History:  Procedure Laterality Date  . CESAREAN SECTION    . CYST EXCISION     Family History  Problem Relation Age of Onset  . Heart disease Father   . Prostate cancer Father   . Diabetes Maternal Aunt   . Breast cancer Neg Hx    Social History   Tobacco Use  . Smoking status: Former Smoker    Types: Cigarettes    Last attempt to quit: 2004    Years since quitting: 15.9  . Smokeless tobacco: Never Used  . Tobacco comment: quit  Substance Use Topics  . Alcohol use: Yes    Frequency: Never    Comment: occasionally  . Drug use: No     Interim medical history since last visit reviewed. Allergies and medications reviewed  Review of Systems Per  HPI unless specifically indicated above     Objective:    BP 140/81   Pulse 80   Temp 97.9 F (36.6 C)   Ht 5' 4" (1.626 m)   Wt 259 lb 14.4 oz (117.9 kg)   LMP 06/25/2014 (Approximate)   BMI 44.61 kg/m   Wt Readings from Last 3 Encounters:  12/29/17 259 lb 14.4 oz (117.9 kg)  11/11/17 254 lb (115.2 kg)  06/23/17 255 lb 12.8 oz (116 kg)    Physical Exam  Constitutional: She appears well-developed and well-nourished.  HENT:  Mouth/Throat: Mucous membranes are normal.  Eyes: EOM are normal. No scleral icterus.  Cardiovascular: Normal rate and regular rhythm.  Pulmonary/Chest: Effort normal and breath sounds normal.  Psychiatric: She has a normal mood and affect. Her behavior is normal.    Results for orders placed or performed in visit on 12/29/17  Comp Met (CMET)  Result Value Ref Range   Glucose CANCELED mg/dL   Hays CANCELED    Creatinine, Ser CANCELED    Sodium CANCELED    Potassium CANCELED    Chloride CANCELED    CO2 CANCELED    Calcium CANCELED    Total Protein CANCELED  Albumin CANCELED    Bilirubin Total CANCELED    Alkaline Phosphatase CANCELED    AST CANCELED    ALT CANCELED   HgB A1c  Result Value Ref Range   Hgb A1c MFr Bld 5.6 4.8 - 5.6 %   Est. average glucose Bld gHb Est-mCnc 114 mg/dL  Lipid Profile  Result Value Ref Range   Cholesterol, Total CANCELED mg/dL   Triglycerides CANCELED    HDL CANCELED   Specimen status report  Result Value Ref Range   specimen status report Comment       Assessment & Plan:   Problem List Items Addressed This Visit      Cardiovascular and Mediastinum   Hypertension - Primary (Chronic)    DASH guidelines; encouraged healthy eating, weight mnagement      Relevant Orders   Comp Met (CMET) (Completed)   HgB A1c (Completed)   Lipid Profile (Completed)     Other   Prediabetes (Chronic)    Check A1c; encouraged weight loss      Relevant Orders   Comp Met (CMET) (Completed)   HgB A1c (Completed)    Lipid Profile (Completed)   Morbid obesity (HCC) (Chronic)    Encouraged weigh tloss; see AVS      Relevant Orders   Comp Met (CMET) (Completed)   HgB A1c (Completed)   Lipid Profile (Completed)    Other Visit Diagnoses    Medication monitoring encounter       Relevant Orders   COMPLETE METABOLIC PANEL WITH GFR   Comp Met (CMET) (Completed)   HgB A1c (Completed)   Lipid Profile (Completed)       Follow up plan: No follow-ups on file.  An after-visit summary was printed and given to the patient at check-out.  Please see the patient instructions which may contain other information and recommendations beyond what is mentioned above in the assessment and plan.  No orders of the defined types were placed in this encounter.   Orders Placed This Encounter  Procedures  . COMPLETE METABOLIC PANEL WITH GFR  . Comp Met (CMET)  . HgB A1c  . Lipid Profile  . Specimen status report       

## 2017-12-29 NOTE — Assessment & Plan Note (Signed)
Check A1c; encouraged weight loss 

## 2017-12-29 NOTE — Patient Instructions (Addendum)
Check out the information at familydoctor.org entitled "Nutrition for Weight Loss: What You Need to Know about Fad Diets" Try to lose between 1-2 pounds per week by taking in fewer calories and burning off more calories You can succeed by limiting portions, limiting foods dense in calories and fat, becoming more active, and drinking 8 glasses of water a day (64 ounces) Don't skip meals, especially breakfast, as skipping meals may alter your metabolism Do not use over-the-counter weight loss pills or gimmicks that claim rapid weight loss A healthy BMI (or body mass index) is between 18.5 and 24.9 You can calculate your ideal BMI at the NIH website JobEconomics.hu  Check out the web site or the APP for American Diabetes Associations  Consider buckwheat pancakes, oatmeal, other heartier grains for breakfast  Avoid Lite Salt, No Salt, no salt substitutes; avoid anything that says it has potassium chloride in it  Obesity, Adult Obesity is the condition of having too much total body fat. Being overweight or obese means that your weight is greater than what is considered healthy for your body size. Obesity is determined by a measurement called BMI. BMI is an estimate of body fat and is calculated from height and weight. For adults, a BMI of 30 or higher is considered obese. Obesity can eventually lead to other health concerns and major illnesses, including:  Stroke.  Coronary artery disease (CAD).  Type 2 diabetes.  Some types of cancer, including cancers of the colon, breast, uterus, and gallbladder.  Osteoarthritis.  High blood pressure (hypertension).  High cholesterol.  Sleep apnea.  Gallbladder stones.  Infertility problems.  What are the causes? The main cause of obesity is taking in (consuming) more calories than your body uses for energy. Other factors that contribute to this condition may include:  Being born with  genes that make you more likely to become obese.  Having a medical condition that causes obesity. These conditions include: ? Hypothyroidism. ? Polycystic ovarian syndrome (PCOS). ? Binge-eating disorder. ? Cushing syndrome.  Taking certain medicines, such as steroids, antidepressants, and seizure medicines.  Not being physically active (sedentary lifestyle).  Living where there are limited places to exercise safely or buy healthy foods.  Not getting enough sleep.  What increases the risk? The following factors may increase your risk of this condition:  Having a family history of obesity.  Being a woman of African-American descent.  Being a man of Hispanic descent.  What are the signs or symptoms? Having excessive body fat is the main symptom of this condition. How is this diagnosed? This condition may be diagnosed based on:  Your symptoms.  Your medical history.  A physical exam. Your health care provider may measure: ? Your BMI. If you are an adult with a BMI between 25 and less than 30, you are considered overweight. If you are an adult with a BMI of 30 or higher, you are considered obese. ? The distances around your hips and your waist (circumferences). These may be compared to each other to help diagnose your condition. ? Your skinfold thickness. Your health care provider may gently pinch a fold of your skin and measure it.  How is this treated? Treatment for this condition often includes changing your lifestyle. Treatment may include some or all of the following:  Dietary changes. Work with your health care provider and a dietitian to set a weight-loss goal that is healthy and reasonable for you. Dietary changes may include eating: ? Smaller portions. A portion  size is the amount of a particular food that is healthy for you to eat at one time. This varies from person to person. ? Low-calorie or low-fat options. ? More whole grains, fruits, and vegetables.  Regular  physical activity. This may include aerobic activity (cardio) and strength training.  Medicine to help you lose weight. Your health care provider may prescribe medicine if you are unable to lose 1 pound a week after 6 weeks of eating more healthily and doing more physical activity.  Surgery. Surgical options may include gastric banding and gastric bypass. Surgery may be done if: ? Other treatments have not helped to improve your condition. ? You have a BMI of 40 or higher. ? You have life-threatening health problems related to obesity.  Follow these instructions at home:  Eating and drinking   Follow recommendations from your health care provider about what you eat and drink. Your health care provider may advise you to: ? Limit fast foods, sweets, and processed snack foods. ? Choose low-fat options, such as low-fat milk instead of whole milk. ? Eat 5 or more servings of fruits or vegetables every day. ? Eat at home more often. This gives you more control over what you eat. ? Choose healthy foods when you eat out. ? Learn what a healthy portion size is. ? Keep low-fat snacks on hand. ? Avoid sugary drinks, such as soda, fruit juice, iced tea sweetened with sugar, and flavored milk. ? Eat a healthy breakfast.  Drink enough water to keep your urine clear or pale yellow.  Do not go without eating for long periods of time (do not fast) or follow a fad diet. Fasting and fad diets can be unhealthy and even dangerous. Physical Activity  Exercise regularly, as told by your health care provider. Ask your health care provider what types of exercise are safe for you and how often you should exercise.  Warm up and stretch before being active.  Cool down and stretch after being active.  Rest between periods of activity. Lifestyle  Limit the time that you spend in front of your TV, computer, or video game system.  Find ways to reward yourself that do not involve food.  Limit alcohol  intake to no more than 1 drink a day for nonpregnant women and 2 drinks a day for men. One drink equals 12 oz of beer, 5 oz of wine, or 1 oz of hard liquor. General instructions  Keep a weight loss journal to keep track of the food you eat and how much you exercise you get.  Take over-the-counter and prescription medicines only as told by your health care provider.  Take vitamins and supplements only as told by your health care provider.  Consider joining a support group. Your health care provider may be able to recommend a support group.  Keep all follow-up visits as told by your health care provider. This is important. Contact a health care provider if:  You are unable to meet your weight loss goal after 6 weeks of dietary and lifestyle changes. This information is not intended to replace advice given to you by your health care provider. Make sure you discuss any questions you have with your health care provider. Document Released: 03/13/2004 Document Revised: 07/09/2015 Document Reviewed: 11/22/2014 Elsevier Interactive Patient Education  2018 Elsevier Inc.  Preventing Unhealthy [employer], Adult Staying at a healthy weight is important. When fat builds up in your body, you may become overweight or obese. These conditions put  you at greater risk for developing certain health problems, such as heart disease, diabetes, sleeping problems, joint problems, and some cancers. Unhealthy weight gain is often the result of making unhealthy choices in what you eat. It is also a result of not getting enough exercise. You can make changes to your lifestyle to prevent obesity and stay as healthy as possible. What nutrition changes can be made? To maintain a healthy weight and prevent obesity:  Eat only as much as your body needs. To do this: ? Pay attention to signs that you are hungry or full. Stop eating as soon as you feel full. ? If you feel hungry, try drinking water first. Drink enough water  so your urine is clear or pale yellow. ? Eat smaller portions. ? Look at serving sizes on food labels. Most foods contain more than one serving per container. ? Eat the recommended amount of calories for your gender and activity level. While most active people should eat around 2,000 calories per day, if you are trying to lose weight or are not very active, you main need to eat less calories. Talk to your health care provider or dietitian about how many calories you should eat each day.  Choose healthy foods, such as: ? Fruits and vegetables. Try to fill at least half of your plate at each meal with fruits and vegetables. ? Whole grains, such as whole wheat bread, brown rice, and quinoa. ? Lean meats, such as chicken or fish. ? Other healthy proteins, such as beans, eggs, or tofu. ? Healthy fats, such as nuts, seeds, fatty fish, and olive oil. ? Low-fat or fat-free dairy.  Check food labels and avoid food and drinks that: ? Are high in calories. ? Have added sugar. ? Are high in sodium. ? Have saturated fats or trans fats.  Limit how much you eat of the following foods: ? Prepackaged meals. ? Fast food. ? Fried foods. ? Processed meat, such as bacon, sausage, and deli meats. ? Fatty cuts of red meat and poultry with skin.  Cook foods in healthier ways, such as by baking, broiling, or grilling.  When grocery shopping, try to shop around the outside of the store. This helps you buy mostly fresh foods and avoid canned and prepackaged foods.  What lifestyle changes can be made?  Exercise at least 30 minutes 5 or more days each week. Exercising includes brisk walking, yard work, biking, running, swimming, and team sports like basketball and soccer. Ask your health care provider which exercises are safe for you.  Do not use any products that contain nicotine or tobacco, such as cigarettes and e-cigarettes. If you need help quitting, ask your health care provider.  Limit alcohol intake  to no more than 1 drink a day for nonpregnant women and 2 drinks a day for men. One drink equals 12 oz of beer, 5 oz of wine, or 1 oz of hard liquor.  Try to get 7-9 hours of sleep each night. What other changes can be made?  Keep a food and activity journal to keep track of: ? What you ate and how many calories you had. Remember to count sauces, dressings, and side dishes. ? Whether you were active, and what exercises you did. ? Your calorie, weight, and activity goals.  Check your weight regularly. Track any changes. If you notice you have gained weight, make changes to your diet or activity routine.  Avoid taking weight-loss medicines or supplements. Talk to your health care  provider before starting any new medicine or supplement.  Talk to your health care provider before trying any new diet or exercise plan. Why are these changes important? Eating healthy, staying active, and having healthy habits not only help prevent obesity, they also:  Help you to manage stress and emotions.  Help you to connect with friends and family.  Improve your self-esteem.  Improve your sleep.  Prevent long-term health problems.  What can happen if changes are not made? Being obese or overweight can cause you to develop joint or bone problems, which can make it hard for you to stay active or do activities you enjoy. Being obese or overweight also puts stress on your heart and lungs and can lead to health problems like diabetes, heart disease, and some cancers. Where to find more information: Talk with your health care provider or a dietitian about healthy eating and healthy lifestyle choices. You may also find other information through these resources:  U.S. Department of Agriculture MyPlate: https://ball-collins.biz/  American Heart Association: www.heart.org  Centers for Disease Control and Prevention: FootballExhibition.com.br  Summary  Staying at a healthy weight is important. It helps prevent certain  diseases and health problems, such as heart disease, diabetes, joint problems, sleep disorders, and some cancers.  Being obese or overweight can cause you to develop joint or bone problems, which can make it hard for you to stay active or do activities you enjoy.  You can prevent unhealthy weight gain by eating a healthy diet, exercising regularly, not smoking, limiting alcohol, and getting enough sleep.  Talk with your health care provider or a dietitian for guidance about healthy eating and healthy lifestyle choices. This information is not intended to replace advice given to you by your health care provider. Make sure you discuss any questions you have with your health care provider. Document Released: 02/05/2016 Document Revised: 03/12/2016 Document Reviewed: 03/12/2016 Elsevier Interactive Patient Education  2018 [employer].  DASH Eating Plan DASH stands for "Dietary Approaches to Stop Hypertension." The DASH eating plan is a healthy eating plan that has been shown to reduce high blood pressure (hypertension). It may also reduce your risk for type 2 diabetes, heart disease, and stroke. The DASH eating plan may also help with weight loss. What are tips for following this plan? General guidelines  Avoid eating more than 2,300 mg (milligrams) of salt (sodium) a day. If you have hypertension, you may need to reduce your sodium intake to 1,500 mg a day.  Limit alcohol intake to no more than 1 drink a day for nonpregnant women and 2 drinks a day for men. One drink equals 12 oz of beer, 5 oz of wine, or 1 oz of hard liquor.  Work with your health care provider to maintain a healthy body weight or to lose weight. Ask what an ideal weight is for you.  Get at least 30 minutes of exercise that causes your heart to beat faster (aerobic exercise) most days of the week. Activities may include walking, swimming, or biking.  Work with your health care provider or diet and nutrition specialist  (dietitian) to adjust your eating plan to your individual calorie needs. Reading food labels  Check food labels for the amount of sodium per serving. Choose foods with less than 5 percent of the Daily Value of sodium. Generally, foods with less than 300 mg of sodium per serving fit into this eating plan.  To find whole grains, look for the word "whole" as the first word  in the ingredient list. Shopping  Buy products labeled as "low-sodium" or "no salt added."  Buy fresh foods. Avoid canned foods and premade or frozen meals. Cooking  Avoid adding salt when cooking. Use salt-free seasonings or herbs instead of table salt or sea salt. Check with your health care provider or pharmacist before using salt substitutes.  Do not fry foods. Cook foods using healthy methods such as baking, boiling, grilling, and broiling instead.  Cook with heart-healthy oils, such as olive, canola, soybean, or sunflower oil. Meal planning   Eat a balanced diet that includes: ? 5 or more servings of fruits and vegetables each day. At each meal, try to fill half of your plate with fruits and vegetables. ? Up to 6-8 servings of whole grains each day. ? Less than 6 oz of lean meat, poultry, or fish each day. A 3-oz serving of meat is about the same size as a deck of cards. One egg equals 1 oz. ? 2 servings of low-fat dairy each day. ? A serving of nuts, seeds, or beans 5 times each week. ? Heart-healthy fats. Healthy fats called Omega-3 fatty acids are found in foods such as flaxseeds and coldwater fish, like sardines, salmon, and mackerel.  Limit how much you eat of the following: ? Canned or prepackaged foods. ? Food that is high in trans fat, such as fried foods. ? Food that is high in saturated fat, such as fatty meat. ? Sweets, desserts, sugary drinks, and other foods with added sugar. ? Full-fat dairy products.  Do not salt foods before eating.  Try to eat at least 2 vegetarian meals each week.  Eat  more home-cooked food and less restaurant, buffet, and fast food.  When eating at a restaurant, ask that your food be prepared with less salt or no salt, if possible. What foods are recommended? The items listed may not be a complete list. Talk with your dietitian about what dietary choices are best for you. Grains Whole-grain or whole-wheat bread. Whole-grain or whole-wheat pasta. Brown rice. Orpah Cobb. Bulgur. Whole-grain and low-sodium cereals. Pita bread. Low-fat, low-sodium crackers. Whole-wheat flour tortillas. Vegetables Fresh or frozen vegetables (raw, steamed, roasted, or grilled). Low-sodium or reduced-sodium tomato and vegetable juice. Low-sodium or reduced-sodium tomato sauce and tomato paste. Low-sodium or reduced-sodium canned vegetables. Fruits All fresh, dried, or frozen fruit. Canned fruit in natural juice (without added sugar). Meat and other protein foods Skinless chicken or [place]. Ground chicken or [place]. Pork with fat trimmed off. Fish and seafood. Egg whites. Dried beans, peas, or lentils. Unsalted nuts, nut butters, and seeds. Unsalted canned beans. Lean cuts of beef with fat trimmed off. Low-sodium, lean deli meat. Dairy Low-fat (1%) or fat-free (skim) milk. Fat-free, low-fat, or reduced-fat cheeses. Nonfat, low-sodium ricotta or cottage cheese. Low-fat or nonfat yogurt. Low-fat, low-sodium cheese. Fats and oils Soft margarine without trans fats. Vegetable oil. Low-fat, reduced-fat, or light mayonnaise and salad dressings (reduced-sodium). Canola, safflower, olive, soybean, and sunflower oils. Avocado. Seasoning and other foods Herbs. Spices. Seasoning mixes without salt. Unsalted popcorn and pretzels. Fat-free sweets. What foods are not recommended? The items listed may not be a complete list. Talk with your dietitian about what dietary choices are best for you. Grains Baked goods made with fat, such as croissants, muffins, or some breads. Dry pasta or rice meal  packs. Vegetables Creamed or fried vegetables. Vegetables in a cheese sauce. Regular canned vegetables (not low-sodium or reduced-sodium). Regular canned tomato sauce and paste (not low-sodium or  reduced-sodium). Regular tomato and vegetable juice (not low-sodium or reduced-sodium). Rosita Fire. Olives. Fruits Canned fruit in a light or heavy syrup. Fried fruit. Fruit in cream or butter sauce. Meat and other protein foods Fatty cuts of meat. Ribs. Fried meat. Tomasa Blase. Sausage. Bologna and other processed lunch meats. Salami. Fatback. Hotdogs. Bratwurst. Salted nuts and seeds. Canned beans with added salt. Canned or smoked fish. Whole eggs or egg yolks. Chicken or [place] with skin. Dairy Whole or 2% milk, cream, and half-and-half. Whole or full-fat cream cheese. Whole-fat or sweetened yogurt. Full-fat cheese. Nondairy creamers. Whipped toppings. Processed cheese and cheese spreads. Fats and oils Butter. Stick margarine. Lard. Shortening. Ghee. Bacon fat. Tropical oils, such as coconut, palm kernel, or palm oil. Seasoning and other foods Salted popcorn and pretzels. Onion salt, garlic salt, seasoned salt, table salt, and sea salt. Worcestershire sauce. Tartar sauce. Barbecue sauce. Teriyaki sauce. Soy sauce, including reduced-sodium. Steak sauce. Canned and packaged gravies. Fish sauce. Oyster sauce. Cocktail sauce. Horseradish that you find on the shelf. Ketchup. Mustard. Meat flavorings and tenderizers. Bouillon cubes. Hot sauce and Tabasco sauce. Premade or packaged marinades. Premade or packaged taco seasonings. Relishes. Regular salad dressings. Where to find more information:  National Heart, Lung, and Blood Institute: PopSteam.is  American Heart Association: www.heart.org Summary  The DASH eating plan is a healthy eating plan that has been shown to reduce high blood pressure (hypertension). It may also reduce your risk for type 2 diabetes, heart disease, and stroke.  With the DASH eating  plan, you should limit salt (sodium) intake to 2,300 mg a day. If you have hypertension, you may need to reduce your sodium intake to 1,500 mg a day.  When on the DASH eating plan, aim to eat more fresh fruits and vegetables, whole grains, lean proteins, low-fat dairy, and heart-healthy fats.  Work with your health care provider or diet and nutrition specialist (dietitian) to adjust your eating plan to your individual calorie needs. This information is not intended to replace advice given to you by your health care provider. Make sure you discuss any questions you have with your health care provider. Document Released: 01/23/2011 Document Revised: 01/28/2016 Document Reviewed: 01/28/2016 Elsevier Interactive Patient Education  [employer].

## 2017-12-29 NOTE — Assessment & Plan Note (Signed)
DASH guidelines; encouraged healthy eating, weight mnagement

## 2017-12-29 NOTE — Assessment & Plan Note (Signed)
Encouraged weight loss; see AVS 

## 2017-12-30 LAB — COMPREHENSIVE METABOLIC PANEL

## 2017-12-30 LAB — LIPID PANEL

## 2017-12-30 LAB — HEMOGLOBIN A1C
Est. average glucose Bld gHb Est-mCnc: 114 mg/dL
Hgb A1c MFr Bld: 5.6 % (ref 4.8–5.6)

## 2017-12-30 LAB — SPECIMEN STATUS REPORT

## 2018-05-20 ENCOUNTER — Ambulatory Visit: Payer: Self-pay | Admitting: Adult Health Nurse Practitioner

## 2018-05-20 DIAGNOSIS — I10 Essential (primary) hypertension: Secondary | ICD-10-CM

## 2018-05-20 DIAGNOSIS — R7303 Prediabetes: Secondary | ICD-10-CM

## 2018-05-20 NOTE — Progress Notes (Signed)
  Patient: Crystal Hays Female    DOB: 02/20/1960   58 y.o.   MRN: [id number] Visit Date: 05/20/2018  Today's Provider: Jacelyn Pi, NP   No chief complaint on file.  Subjective:    HPI   Telephonic visit  Currently out of Losartan- has been out for a month. She is having a difficult time getting Losartan- states that the manufacturer was not shipping it out according to her pharmacy.  BPs 136/92.HR-117 states that it's higher than usual.     No Known Allergies Previous Medications   ALBUTEROL (PROVENTIL HFA;VENTOLIN HFA) 108 (90 BASE) MCG/ACT INHALER    Inhale 2 puffs into the lungs every 6 (six) hours as needed for wheezing or shortness of breath.   ALBUTEROL (PROVENTIL) (2.5 MG/3ML) 0.083% NEBULIZER SOLUTION    Take 3 mLs (2.5 mg total) by nebulization every 6 (six) hours as needed for wheezing or shortness of breath.   BECLOMETHASONE (QVAR) 40 MCG/ACT INHALER    Inhale 2 puffs into the lungs 2 (two) times daily.   HYDROCHLOROTHIAZIDE (HYDRODIURIL) 25 MG TABLET    TAKE ONE TABLET BY MOUTH EVERY DAY. TAKE WITH LOSARTAN.   LOSARTAN (COZAAR) 100 MG TABLET    TAKE ONE TABLET BY MOUTH EVERY DAY. TAKE WITH HCTZ.   TRIAMCINOLONE (KENALOG) 0.025 % CREAM    Apply 1 application topically as needed.    Review of Systems  All other systems reviewed and are negative.   Social History   Tobacco Use  . Smoking status: Former Smoker    Types: Cigarettes    Last attempt to quit: 2004    Years since quitting: 16.2  . Smokeless tobacco: Never Used  . Tobacco comment: quit  Substance Use Topics  . Alcohol use: Yes    Frequency: Never    Comment: occasionally   Objective:   LMP 06/25/2014 (Approximate)   Physical Exam  No PE    Assessment & Plan:         HTN:  Goal BP <140/90.  Continue HCTZ- remain off losartan.  Take BPs 3 x weekly- keep log and bring to next OV. Encourage low salt diet and exercise.   FU in 2 months with labs a week prior.   Jacelyn Pi, NP    [facility]

## 2018-05-25 ENCOUNTER — Ambulatory Visit: Payer: Self-pay

## 2018-06-02 ENCOUNTER — Other Ambulatory Visit: Payer: Self-pay

## 2018-06-02 MED ORDER — HYDROCHLOROTHIAZIDE 25 MG PO TABS: ORAL_TABLET | ORAL | 2 refills | Status: DC

## 2018-07-15 ENCOUNTER — Other Ambulatory Visit: Payer: Self-pay

## 2018-07-22 ENCOUNTER — Ambulatory Visit: Payer: Self-pay

## 2018-08-05 ENCOUNTER — Other Ambulatory Visit: Payer: Self-pay

## 2018-08-05 DIAGNOSIS — I10 Essential (primary) hypertension: Secondary | ICD-10-CM

## 2018-08-05 DIAGNOSIS — R7303 Prediabetes: Secondary | ICD-10-CM

## 2018-08-06 LAB — COMPREHENSIVE METABOLIC PANEL
ALT: 13 IU/L (ref 0–32)
AST: 16 IU/L (ref 0–40)
Albumin/Globulin Ratio: 1.4 (ref 1.2–2.2)
Albumin: 4.4 g/dL (ref 3.8–4.9)
Alkaline Phosphatase: 68 IU/L (ref 39–117)
BUN/Creatinine Ratio: 21 (ref 9–23)
BUN: 14 mg/dL (ref 6–24)
Bilirubin Total: 0.3 mg/dL (ref 0.0–1.2)
CO2: 29 mmol/L (ref 20–29)
Calcium: 9.3 mg/dL (ref 8.7–10.2)
Chloride: 91 mmol/L — ABNORMAL LOW (ref 96–106)
Creatinine, Ser: 0.66 mg/dL (ref 0.57–1.00)
GFR calc Af Amer: 113 mL/min/{1.73_m2} (ref >59)
GFR calc non Af Amer: 98 mL/min/{1.73_m2} (ref >59)
Globulin, Total: 3.2 g/dL (ref 1.5–4.5)
Glucose: 95 mg/dL (ref 65–99)
Potassium: 3.6 mmol/L (ref 3.5–5.2)
Sodium: 136 mmol/L (ref 134–144)
Total Protein: 7.6 g/dL (ref 6.0–8.5)

## 2018-08-06 LAB — HEMOGLOBIN A1C
Est. average glucose Bld gHb Est-mCnc: 117 mg/dL
Hgb A1c MFr Bld: 5.7 % — ABNORMAL HIGH (ref 4.8–5.6)

## 2018-08-06 LAB — CBC
Hematocrit: 43.1 % (ref 34.0–46.6)
Hemoglobin: 13.9 g/dL (ref 11.1–15.9)
MCH: 27.2 pg (ref 26.6–33.0)
MCHC: 32.3 g/dL (ref 31.5–35.7)
MCV: 84 fL (ref 79–97)
Platelets: 266 10*3/uL (ref 150–450)
RBC: 5.11 x10E6/uL (ref 3.77–5.28)
RDW: 13.3 % (ref 11.7–15.4)
WBC: 7 10*3/uL (ref 3.4–10.8)

## 2018-08-06 LAB — LIPID PANEL
Chol/HDL Ratio: 2.7 ratio (ref 0.0–4.4)
Cholesterol, Total: 203 mg/dL — ABNORMAL HIGH (ref 100–199)
HDL: 74 mg/dL (ref >39)
LDL Calculated: 110 mg/dL — ABNORMAL HIGH (ref 0–99)
Triglycerides: 94 mg/dL (ref 0–149)
VLDL Cholesterol Cal: 19 mg/dL (ref 5–40)

## 2018-08-19 ENCOUNTER — Ambulatory Visit: Payer: Self-pay | Admitting: Adult Health Nurse Practitioner

## 2018-08-19 ENCOUNTER — Other Ambulatory Visit: Payer: Self-pay

## 2018-08-19 DIAGNOSIS — R7303 Prediabetes: Secondary | ICD-10-CM

## 2018-08-19 DIAGNOSIS — I10 Essential (primary) hypertension: Secondary | ICD-10-CM

## 2018-08-19 MED ORDER — TRIAMCINOLONE ACETONIDE 0.025 % EX CREA: 1.0000 "application " | TOPICAL_CREAM | CUTANEOUS | 1 refills | Status: DC | PRN

## 2018-08-19 MED ORDER — HYDROCHLOROTHIAZIDE 25 MG PO TABS: ORAL_TABLET | ORAL | 4 refills | Status: DC

## 2018-08-19 MED ORDER — ALBUTEROL SULFATE HFA 108 (90 BASE) MCG/ACT IN AERS: 1.0000 | INHALATION_SPRAY | Freq: Four times a day (QID) | RESPIRATORY_TRACT | 3 refills | Status: DC | PRN

## 2018-08-19 NOTE — Progress Notes (Signed)
  Patient: Crystal Hays Female    DOB: [birth year]   58 y.o.   MRN: [id number] Visit Date: 08/19/2018  Today's Provider: [facility]   No chief complaint on file.  Subjective:    HPI  Telephonic visit.   States that she has changed her diet and is exercising 4x daily.  Taking medications as directed.  Currently off inhalers.     No Known Allergies Previous Medications   ALBUTEROL (PROVENTIL HFA;VENTOLIN HFA) 108 (90 BASE) MCG/ACT INHALER    Inhale 2 puffs into the lungs every 6 (six) hours as needed for wheezing or shortness of breath.   ALBUTEROL (PROVENTIL) (2.5 MG/3ML) 0.083% NEBULIZER SOLUTION    Take 3 mLs (2.5 mg total) by nebulization every 6 (six) hours as needed for wheezing or shortness of breath.   BECLOMETHASONE (QVAR) 40 MCG/ACT INHALER    Inhale 2 puffs into the lungs 2 (two) times daily.   HYDROCHLOROTHIAZIDE (HYDRODIURIL) 25 MG TABLET    TAKE ONE TABLET BY MOUTH EVERY DAY   TRIAMCINOLONE (KENALOG) 0.025 % CREAM    Apply 1 application topically as needed.    Review of Systems  All other systems reviewed and are negative.   Social History   Tobacco Use  . Smoking status: Former Smoker    Types: Cigarettes    Quit date: 2004    Years since quitting: 16.5  . Smokeless tobacco: Never Used  . Tobacco comment: quit  Substance Use Topics  . Alcohol use: Yes    Frequency: Never    Comment: occasionally   Objective:   LMP 06/25/2014 (Approximate)   Physical Exam BP at home: 132/79 HR-90 No PE.     Assessment & Plan:     Reviewed labs.   HTN:  Goal BP <140/90.  Continue current medication regimen.  Encourage low salt diet and exercise.   LDL slightly elevated.  Continue healthy lifestyle changes.   Will refill rx for albuterol and triamcinolone.        [facility]

## 2018-08-26 ENCOUNTER — Other Ambulatory Visit: Payer: Self-pay | Admitting: *Deleted

## 2018-08-26 DIAGNOSIS — Z20822 Contact with and (suspected) exposure to covid-19: Secondary | ICD-10-CM

## 2018-08-31 LAB — NOVEL CORONAVIRUS, NAA: SARS-CoV-2, NAA: NOT DETECTED

## 2018-11-15 ENCOUNTER — Telehealth: Payer: Self-pay

## 2018-11-15 NOTE — Telephone Encounter (Signed)
Pre-visit screening call attempted prior to [facility] appointment on 11/17/2018. No answer / no need for call back.

## 2018-11-17 ENCOUNTER — Ambulatory Visit: Payer: Self-pay | Attending: Oncology

## 2018-11-17 ENCOUNTER — Other Ambulatory Visit: Payer: Self-pay

## 2018-11-17 ENCOUNTER — Ambulatory Visit
Admission: RE | Admit: 2018-11-17 | Discharge: 2018-11-17 | Disposition: A | Discharge status: Home / Self Care | Payer: Self-pay | Source: Ambulatory Visit | Attending: Oncology | Admitting: Oncology

## 2018-11-17 VITALS — BP 158/83 | HR 67 | Temp 96.0°F | Ht 65.0 in | Wt 237.0 lb

## 2018-11-17 DIAGNOSIS — Z1231 Encounter for screening mammogram for malignant neoplasm of breast: Secondary | ICD-10-CM | POA: Insufficient documentation

## 2018-11-17 DIAGNOSIS — Z0000 Encounter for general adult medical examination without abnormal findings: Secondary | ICD-10-CM

## 2018-11-17 NOTE — Progress Notes (Signed)
  Subjective:     Patient ID: Crystal Hays, female   DOB: [birth year], 58 y.o.   MRN: [id number]  HPI   Review of Systems     Objective:   Physical Exam Chest:     Breasts:        Right: No swelling, bleeding, inverted nipple, mass, nipple discharge, skin change or tenderness.        Left: No swelling, bleeding, inverted nipple, mass, nipple discharge, skin change or tenderness.  Genitourinary:    Labia:        Right: No rash, tenderness, lesion or injury.        Left: No rash, tenderness, lesion or injury.      Cervix: No cervical motion tenderness, discharge, friability, lesion, erythema, cervical bleeding or eversion.     Uterus: Not deviated, not enlarged, not fixed, not tender and no uterine prolapse.      Adnexa:        Right: No mass, tenderness or fullness.         Left: No mass, tenderness or fullness.       Comments: Large pendulous breasts       Assessment:     58 year old patient presents for [facility] visit.  Patient screened, and meets BCCCP eligibility.  Patient does not have insurance, Medicare or Medicaid. Instructed patient on breast self awareness using teach back method.  Clinical breast exam unremarkable. No mass or lump. Patient had a negative/negative pap last year, but negative positive pap year before.  Risk Assessment    Risk Scores      11/17/2018 11/11/2017   Last edited by: Rico Junker, RN Theodore Demark, RN   5-year risk: 1.4 % 1.4 %   Lifetime risk: 7.3 % 7.5 %            Plan:     Sent for bilateral screening mammogram. Specimen collected for pap.

## 2018-11-28 LAB — PAP LB AND HPV HIGH-RISK: HPV, high-risk: NEGATIVE

## 2018-11-28 NOTE — Progress Notes (Signed)
Letter mailed to patient to notify of normal mammogram, and pap smear results.Patient to return in one year for annual screening.  Next pap due in 5 years.  Copy to [facility]. 

## 2018-12-09 ENCOUNTER — Other Ambulatory Visit: Payer: Self-pay

## 2018-12-09 MED ORDER — HYDROCHLOROTHIAZIDE 25 MG PO TABS: ORAL_TABLET | ORAL | 4 refills | Status: DC

## 2018-12-14 ENCOUNTER — Encounter: Payer: Self-pay | Admitting: *Deleted

## 2019-02-24 ENCOUNTER — Other Ambulatory Visit: Payer: Self-pay

## 2019-02-24 ENCOUNTER — Ambulatory Visit: Payer: Self-pay | Admitting: Family Medicine

## 2019-02-24 DIAGNOSIS — I10 Essential (primary) hypertension: Secondary | ICD-10-CM

## 2019-02-24 DIAGNOSIS — R7303 Prediabetes: Secondary | ICD-10-CM

## 2019-02-24 DIAGNOSIS — L209 Atopic dermatitis, unspecified: Secondary | ICD-10-CM

## 2019-02-24 MED ORDER — TRIAMCINOLONE ACETONIDE 0.025 % EX CREA: 1.0000 "application " | TOPICAL_CREAM | CUTANEOUS | 1 refills | Status: DC | PRN

## 2019-02-24 MED ORDER — HYDROCHLOROTHIAZIDE 25 MG PO TABS: ORAL_TABLET | ORAL | 2 refills | Status: DC

## 2019-02-24 NOTE — Progress Notes (Signed)
Virtual Visit via Telephone Note  I connected with Crystal Hays on 02/24/19 at  5:45 PM EST by telephone and verified that I am speaking with the correct person using two identifiers.  Location:  I discussed the limitations, risks, security and privacy concerns of performing an evaluation and management service by telephone and the availability of in person appointments. I also discussed with the patient that there may be a patient responsible charge related to this service. The patient expressed understanding and agreed to proceed.   History of Present Illness: Patient with a history of hypertension. She reports taking her BP daily with today's reading in 138/80 without medications. She reports that she has changed her diet and is exercising daily. She states that she has decreased in weight from 259 to 227. Patient states that she is doing well on her current medications. She states that she would like a refill on her triamcinolone cream for her atopic dermatitis. She is doing well and without complaint today.    Observations/Objective: Patient speaking with normal tone, rate and rhythm. No apparent distress noted.   Assessment and Plan: 1. Essential hypertension No medication changes warranted at the present time.  - hydrochlorothiazide (HYDRODIURIL) 25 MG tablet; TAKE ONE TABLET BY MOUTH EVERY DAY  Dispense: 30 tablet; Refill: 2 - CBC With Differential - Future; Future - Comprehensive metabolic panel; Future - Lipid Panel With LDL/HDL Ratio; Future  2. Prediabetes Praised for weight loss efforts and life style changes.  - Lipid Panel With LDL/HDL Ratio; Future - HgB A1c; Future  3. Atopic dermatitis, unspecified type - triamcinolone (KENALOG) 0.025 % cream; Apply 1 application topically as needed.  Dispense: 30 g; Refill: 1   Follow Up Instructions:  Return to clinic in 3 months. One week prior, fasting labs.     I discussed the assessment and treatment plan with the  patient. The patient was provided an opportunity to ask questions and all were answered. The patient agreed with the plan and demonstrated an understanding of the instructions.   The patient was advised to call back or seek an in-person evaluation if the symptoms worsen or if the condition fails to improve as anticipated.  I provided 15 minutes of non-face-to-face time during this encounter.   Mike Gip, FNP

## 2019-02-24 NOTE — Patient Instructions (Signed)
Health Maintenance, Female Adopting a healthy lifestyle and getting preventive care are important in promoting health and wellness. Ask your health care provider about:  The right schedule for you to have regular tests and exams.  Things you can do on your own to prevent diseases and keep yourself healthy. What should I know about diet, weight, and exercise? Eat a healthy diet   Eat a diet that includes plenty of vegetables, fruits, low-fat dairy products, and lean protein.  Do not eat a lot of foods that are high in solid fats, added sugars, or sodium. Maintain a healthy weight Body mass index (BMI) is used to identify weight problems. It estimates body fat based on height and weight. Your health care provider can help determine your BMI and help you achieve or maintain a healthy weight. Get regular exercise Get regular exercise. This is one of the most important things you can do for your health. Most adults should:  Exercise for at least 150 minutes each week. The exercise should increase your heart rate and make you sweat (moderate-intensity exercise).  Do strengthening exercises at least twice a week. This is in addition to the moderate-intensity exercise.  Spend less time sitting. Even light physical activity can be beneficial. Watch cholesterol and blood lipids Have your blood tested for lipids and cholesterol at 59 years of age, then have this test every 5 years. Have your cholesterol levels checked more often if:  Your lipid or cholesterol levels are high.  You are older than 59 years of age.  You are at high risk for heart disease. What should I know about cancer screening? Depending on your health history and family history, you may need to have cancer screening at various ages. This may include screening for:  Breast cancer.  Cervical cancer.  Colorectal cancer.  Skin cancer.  Lung cancer. What should I know about heart disease, diabetes, and high blood  pressure? Blood pressure and heart disease  High blood pressure causes heart disease and increases the risk of stroke. This is more likely to develop in people who have high blood pressure readings, are of African descent, or are overweight.  Have your blood pressure checked: ? Every 3-5 years if you are 18-39 years of age. ? Every year if you are 40 years old or older. Diabetes Have regular diabetes screenings. This checks your fasting blood sugar level. Have the screening done:  Once every three years after age 40 if you are at a normal weight and have a low risk for diabetes.  More often and at a younger age if you are overweight or have a high risk for diabetes. What should I know about preventing infection? Hepatitis B If you have a higher risk for hepatitis B, you should be screened for this virus. Talk with your health care provider to find out if you are at risk for hepatitis B infection. Hepatitis C Testing is recommended for:  Everyone born from 1945 through 1965.  Anyone with known risk factors for hepatitis C. Sexually transmitted infections (STIs)  Get screened for STIs, including gonorrhea and chlamydia, if: ? You are sexually active and are younger than 59 years of age. ? You are older than 59 years of age and your health care provider tells you that you are at risk for this type of infection. ? Your sexual activity has changed since you were last screened, and you are at increased risk for chlamydia or gonorrhea. Ask your health care provider if   you are at risk.  Ask your health care provider about whether you are at high risk for HIV. Your health care provider may recommend a prescription medicine to help prevent HIV infection. If you choose to take medicine to prevent HIV, you should first get tested for HIV. You should then be tested every 3 months for as long as you are taking the medicine. Pregnancy  If you are about to stop having your period (premenopausal) and  you may become pregnant, seek counseling before you get pregnant.  Take 400 to 800 micrograms (mcg) of folic acid every day if you become pregnant.  Ask for birth control (contraception) if you want to prevent pregnancy. Osteoporosis and menopause Osteoporosis is a disease in which the bones lose minerals and strength with aging. This can result in bone fractures. If you are 65 years old or older, or if you are at risk for osteoporosis and fractures, ask your health care provider if you should:  Be screened for bone loss.  Take a calcium or vitamin D supplement to lower your risk of fractures.  Be given hormone replacement therapy (HRT) to treat symptoms of menopause. Follow these instructions at home: Lifestyle  Do not use any products that contain nicotine or tobacco, such as cigarettes, e-cigarettes, and chewing tobacco. If you need help quitting, ask your health care provider.  Do not use street drugs.  Do not share needles.  Ask your health care provider for help if you need support or information about quitting drugs. Alcohol use  Do not drink alcohol if: ? Your health care provider tells you not to drink. ? You are pregnant, may be pregnant, or are planning to become pregnant.  If you drink alcohol: ? Limit how much you use to 0-1 drink a day. ? Limit intake if you are breastfeeding.  Be aware of how much alcohol is in your drink. In the [place] one drink equals one 12 oz bottle of beer (355 mL), one 5 oz glass of wine (148 mL), or one 1 oz glass of hard liquor (44 mL). General instructions  Schedule regular health, dental, and eye exams.  Stay current with your vaccines.  Tell your health care provider if: ? You often feel depressed. ? You have ever been abused or do not feel safe at home. Summary  Adopting a healthy lifestyle and getting preventive care are important in promoting health and wellness.  Follow your health care provider's instructions about healthy  diet, exercising, and getting tested or screened for diseases.  Follow your health care provider's instructions on monitoring your cholesterol and blood pressure. This information is not intended to replace advice given to you by your health care provider. Make sure you discuss any questions you have with your health care provider. Document Revised: 01/27/2018 Document Reviewed: 01/27/2018 Elsevier Patient Education  2020 [employer]  

## 2019-04-06 ENCOUNTER — Ambulatory Visit: Payer: Self-pay

## 2019-04-21 ENCOUNTER — Ambulatory Visit: Payer: Self-pay | Admitting: Gerontology

## 2019-04-21 ENCOUNTER — Encounter: Payer: Self-pay | Admitting: Gerontology

## 2019-04-21 ENCOUNTER — Other Ambulatory Visit: Payer: Self-pay

## 2019-04-21 VITALS — BP 142/85 | HR 74 | Ht 64.0 in | Wt 236.0 lb

## 2019-04-21 DIAGNOSIS — Z0000 Encounter for general adult medical examination without abnormal findings: Secondary | ICD-10-CM

## 2019-04-21 DIAGNOSIS — I10 Essential (primary) hypertension: Secondary | ICD-10-CM

## 2019-04-21 MED ORDER — HYDROCHLOROTHIAZIDE 25 MG PO TABS: ORAL_TABLET | ORAL | 3 refills | Status: DC

## 2019-04-21 NOTE — Progress Notes (Signed)
Established Patient Office Visit  Subjective:  Patient ID: Crystal Hays, female    DOB: [birth year]  Age: 59 y.o. MRN: [id number]  CC:  Chief Complaint  Patient presents with  . Hypertension    HPI Crystal Hays presents for follow up of hypertension and medication refill. She states that she checks her blood pressure three times weekly and it's usually below 140/85. She reports that she adheres to DASH diet and exercises as tolerated. She denies chest pain, palpitation, light headedness, fever and chills. Overall, she states that she's doing well and offers no further complaint.  Past Medical History:  Diagnosis Date  . Allergy   . Asthma   . Hypertension     Past Surgical History:  Procedure Laterality Date  . CESAREAN SECTION    . CYST EXCISION      Family History  Problem Relation Age of Onset  . Heart disease Father   . Prostate cancer Father   . Diabetes Maternal Aunt   . Breast cancer Neg Hx     Social History   Socioeconomic History  . Marital status: Legally Separated    Spouse name: Not on file  . Number of children: Not on file  . Years of education: Not on file  . Highest education level: Not on file  Occupational History  . Not on file  Tobacco Use  . Smoking status: Former Smoker    Types: Cigarettes    Quit date: 2004    Years since quitting: 17.1  . Smokeless tobacco: Never Used  . Tobacco comment: quit  Substance and Sexual Activity  . Alcohol use: Yes    Comment: occasionally  . Drug use: No  . Sexual activity: Not Currently  Other Topics Concern  . Not on file  Social History Narrative   Lives with son and is well taken care of      Asked about a braces program so I told her about dental and she had already been seen there.      --After I explained the program, patient said she did not need anything from it at this time and did not want to participate. No consent form signed.    Social Determinants of Health   Financial Resource  Strain:   . Difficulty of Paying Living Expenses: Not on file  Food Insecurity: Unknown  . Worried About Charity fundraiser in the Last Year: Not on file  . Ran Out of Food in the Last Year: Never true  Transportation Needs: No Transportation Needs  . Lack of Transportation (Medical): No  . Lack of Transportation (Non-Medical): No  Physical Activity:   . Days of Exercise per Week: Not on file  . Minutes of Exercise per Session: Not on file  Stress:   . Feeling of Stress : Not on file  Social Connections:   . Frequency of Communication with Friends and Family: Not on file  . Frequency of Social Gatherings with Friends and Family: Not on file  . Attends Religious Services: Not on file  . Active Member of Clubs or Organizations: Not on file  . Attends Archivist Meetings: Not on file  . Marital Status: Not on file  Intimate Partner Violence:   . Fear of Current or Ex-Partner: Not on file  . Emotionally Abused: Not on file  . Physically Abused: Not on file  . Sexually Abused: Not on file    Outpatient Medications Prior to Visit  Medication Sig Dispense  Refill  . BLACK ELDERBERRY PO Take by mouth.    . calcium-vitamin D (OSCAL WITH D) 500-200 MG-UNIT tablet Take 1 tablet by mouth.    . hydrochlorothiazide (HYDRODIURIL) 25 MG tablet TAKE ONE TABLET BY MOUTH EVERY DAY 30 tablet 2  . albuterol (VENTOLIN HFA) 108 (90 Base) MCG/ACT inhaler Inhale 1-2 puffs into the lungs every 6 (six) hours as needed for wheezing or shortness of breath. (Patient not taking: Reported on 04/21/2019) 18 g 3  . triamcinolone (KENALOG) 0.025 % cream Apply 1 application topically as needed. (Patient not taking: Reported on 04/21/2019) 30 g 1   No facility-administered medications prior to visit.    No Known Allergies  ROS Review of Systems  Constitutional: Negative.   Eyes: Negative.   Respiratory: Negative.   Cardiovascular: Negative.   Neurological: Negative.   Psychiatric/Behavioral:  Negative.       Objective:    Physical Exam  Constitutional: She is oriented to person, place, and time. She appears well-developed.  HENT:  Head: Normocephalic and atraumatic.  Eyes: Pupils are equal, round, and reactive to light. EOM are normal.  Cardiovascular: Normal rate and regular rhythm.  Pulmonary/Chest: Effort normal and breath sounds normal.  Neurological: She is alert and oriented to person, place, and time.  Psychiatric: She has a normal mood and affect. Her behavior is normal. Judgment and thought content normal.    BP (!) 142/85 (BP Location: Left Arm, Patient Position: Sitting)   Pulse 74   Ht _0  (1.626 m)   Wt 236 lb (107 kg)   LMP 06/25/2014 (Approximate)   SpO2 94%   BMI 40.51 kg/m  Wt Readings from Last 3 Encounters:  04/21/19 236 lb (107 kg)  11/17/18 237 lb (107.5 kg)  12/29/17 259 lb 14.4 oz (117.9 kg)   She was encouraged to continue on her weight loss regimen.  Health Maintenance Due  Topic Date Due  . Hepatitis C Screening  Aug 18, 1960  . HIV Screening  04/08/1975  . TETANUS/TDAP  04/08/1979  . COLONOSCOPY  04/07/2010    There are no preventive care reminders to display for this patient.  Lab Results  Component Value Date   TSH 0.833 06/23/2017   Lab Results  Component Value Date   WBC 7.0 08/05/2018   HGB 13.9 08/05/2018   HCT 43.1 08/05/2018   MCV 84 08/05/2018   PLT 266 08/05/2018   Lab Results  Component Value Date   NA 136 08/05/2018   K 3.6 08/05/2018   CO2 29 08/05/2018   GLUCOSE 95 08/05/2018   BUN 14 08/05/2018   CREATININE 0.66 08/05/2018   BILITOT 0.3 08/05/2018   ALKPHOS 68 08/05/2018   AST 16 08/05/2018   ALT 13 08/05/2018   PROT 7.6 08/05/2018   ALBUMIN 4.4 08/05/2018   CALCIUM 9.3 08/05/2018   Lab Results  Component Value Date   CHOL 203 (H) 08/05/2018   Lab Results  Component Value Date   HDL 74 08/05/2018   Lab Results  Component Value Date   LDLCALC 110 (H) 08/05/2018   Lab Results   Component Value Date   TRIG 94 08/05/2018   Lab Results  Component Value Date   CHOLHDL 2.7 08/05/2018   Lab Results  Component Value Date   HGBA1C 5.7 (H) 08/05/2018      Assessment & Plan:   1. Essential hypertension - Her blood pressure is improving, she will continue on current treatment regimen. - She was encouraged to continue on Low  salt DASH diet -Take medications regularly on time -Exercise regularly as tolerated -Check blood pressure at least once a week at home, record and bring log to follow up appointment. -Goal is less than 140/90 and normal blood pressure is 120/80 - hydrochlorothiazide (HYDRODIURIL) 25 MG tablet; TAKE ONE TABLET BY MOUTH EVERY DAY  Dispense: 30 tablet; Refill: 3  2. Healthcare maintenance She was encouraged to complete charity care application for - Ambulatory referral to Gastroenterology for Colonoscopy screening. - Routine labs will be rechecked. HgB A1c; Future - CBC w/Diff; Future - Comp Met (CMET); Future - Lipid panel; Future     Follow-up: Return in about 3 months (around 07/27/2019), or if symptoms worsen or fail to improve.    Aryanah Enslow Jerold Coombe, NP

## 2019-04-21 NOTE — Patient Instructions (Signed)
DASH Eating Plan DASH stands for "Dietary Approaches to Stop Hypertension." The DASH eating plan is a healthy eating plan that has been shown to reduce high blood pressure (hypertension). It may also reduce your risk for type 2 diabetes, heart disease, and stroke. The DASH eating plan may also help with weight loss. What are tips for following this plan?  General guidelines  Avoid eating more than 2,300 mg (milligrams) of salt (sodium) a day. If you have hypertension, you may need to reduce your sodium intake to 1,500 mg a day.  Limit alcohol intake to no more than 1 drink a day for nonpregnant women and 2 drinks a day for men. One drink equals 12 oz of beer, 5 oz of wine, or 1 oz of hard liquor.  Work with your health care provider to maintain a healthy body weight or to lose weight. Ask what an ideal weight is for you.  Get at least 30 minutes of exercise that causes your heart to beat faster (aerobic exercise) most days of the week. Activities may include walking, swimming, or biking.  Work with your health care provider or diet and nutrition specialist (dietitian) to adjust your eating plan to your individual calorie needs. Reading food labels   Check food labels for the amount of sodium per serving. Choose foods with less than 5 percent of the Daily Value of sodium. Generally, foods with less than 300 mg of sodium per serving fit into this eating plan.  To find whole grains, look for the word "whole" as the first word in the ingredient list. Shopping  Buy products labeled as "low-sodium" or "no salt added."  Buy fresh foods. Avoid canned foods and premade or frozen meals. Cooking  Avoid adding salt when cooking. Use salt-free seasonings or herbs instead of table salt or sea salt. Check with your health care provider or pharmacist before using salt substitutes.  Do not fry foods. Cook foods using healthy methods such as baking, boiling, grilling, and broiling instead.  Cook with  heart-healthy oils, such as olive, canola, soybean, or sunflower oil. Meal planning  Eat a balanced diet that includes: ? 5 or more servings of fruits and vegetables each day. At each meal, try to fill half of your plate with fruits and vegetables. ? Up to 6-8 servings of whole grains each day. ? Less than 6 oz of lean meat, poultry, or fish each day. A 3-oz serving of meat is about the same size as a deck of cards. One egg equals 1 oz. ? 2 servings of low-fat dairy each day. ? A serving of nuts, seeds, or beans 5 times each week. ? Heart-healthy fats. Healthy fats called Omega-3 fatty acids are found in foods such as flaxseeds and coldwater fish, like sardines, salmon, and mackerel.  Limit how much you eat of the following: ? Canned or prepackaged foods. ? Food that is high in trans fat, such as fried foods. ? Food that is high in saturated fat, such as fatty meat. ? Sweets, desserts, sugary drinks, and other foods with added sugar. ? Full-fat dairy products.  Do not salt foods before eating.  Try to eat at least 2 vegetarian meals each week.  Eat more home-cooked food and less restaurant, buffet, and fast food.  When eating at a restaurant, ask that your food be prepared with less salt or no salt, if possible. What foods are recommended? The items listed may not be a complete list. Talk with your dietitian about   what dietary choices are best for you. Grains Whole-grain or whole-wheat bread. Whole-grain or whole-wheat pasta. Brown rice. Oatmeal. Quinoa. Bulgur. Whole-grain and low-sodium cereals. Pita bread. Low-fat, low-sodium crackers. Whole-wheat flour tortillas. Vegetables Fresh or frozen vegetables (raw, steamed, roasted, or grilled). Low-sodium or reduced-sodium tomato and vegetable juice. Low-sodium or reduced-sodium tomato sauce and tomato paste. Low-sodium or reduced-sodium canned vegetables. Fruits All fresh, dried, or frozen fruit. Canned fruit in natural juice (without  added sugar). Meat and other protein foods Skinless chicken or turkey. Ground chicken or turkey. Pork with fat trimmed off. Fish and seafood. Egg whites. Dried beans, peas, or lentils. Unsalted nuts, nut butters, and seeds. Unsalted canned beans. Lean cuts of beef with fat trimmed off. Low-sodium, lean deli meat. Dairy Low-fat (1%) or fat-free (skim) milk. Fat-free, low-fat, or reduced-fat cheeses. Nonfat, low-sodium ricotta or cottage cheese. Low-fat or nonfat yogurt. Low-fat, low-sodium cheese. Fats and oils Soft margarine without trans fats. Vegetable oil. Low-fat, reduced-fat, or light mayonnaise and salad dressings (reduced-sodium). Canola, safflower, olive, soybean, and sunflower oils. Avocado. Seasoning and other foods Herbs. Spices. Seasoning mixes without salt. Unsalted popcorn and pretzels. Fat-free sweets. What foods are not recommended? The items listed may not be a complete list. Talk with your dietitian about what dietary choices are best for you. Grains Baked goods made with fat, such as croissants, muffins, or some breads. Dry pasta or rice meal packs. Vegetables Creamed or fried vegetables. Vegetables in a cheese sauce. Regular canned vegetables (not low-sodium or reduced-sodium). Regular canned tomato sauce and paste (not low-sodium or reduced-sodium). Regular tomato and vegetable juice (not low-sodium or reduced-sodium). Pickles. Olives. Fruits Canned fruit in a light or heavy syrup. Fried fruit. Fruit in cream or butter sauce. Meat and other protein foods Fatty cuts of meat. Ribs. Fried meat. Bacon. Sausage. Bologna and other processed lunch meats. Salami. Fatback. Hotdogs. Bratwurst. Salted nuts and seeds. Canned beans with added salt. Canned or smoked fish. Whole eggs or egg yolks. Chicken or turkey with skin. Dairy Whole or 2% milk, cream, and half-and-half. Whole or full-fat cream cheese. Whole-fat or sweetened yogurt. Full-fat cheese. Nondairy creamers. Whipped toppings.  Processed cheese and cheese spreads. Fats and oils Butter. Stick margarine. Lard. Shortening. Ghee. Bacon fat. Tropical oils, such as coconut, palm kernel, or palm oil. Seasoning and other foods Salted popcorn and pretzels. Onion salt, garlic salt, seasoned salt, table salt, and sea salt. Worcestershire sauce. Tartar sauce. Barbecue sauce. Teriyaki sauce. Soy sauce, including reduced-sodium. Steak sauce. Canned and packaged gravies. Fish sauce. Oyster sauce. Cocktail sauce. Horseradish that you find on the shelf. Ketchup. Mustard. Meat flavorings and tenderizers. Bouillon cubes. Hot sauce and Tabasco sauce. Premade or packaged marinades. Premade or packaged taco seasonings. Relishes. Regular salad dressings. Where to find more information:  National Heart, Lung, and Blood Institute: www.nhlbi.nih.gov  American Heart Association: www.heart.org Summary  The DASH eating plan is a healthy eating plan that has been shown to reduce high blood pressure (hypertension). It may also reduce your risk for type 2 diabetes, heart disease, and stroke.  With the DASH eating plan, you should limit salt (sodium) intake to 2,300 mg a day. If you have hypertension, you may need to reduce your sodium intake to 1,500 mg a day.  When on the DASH eating plan, aim to eat more fresh fruits and vegetables, whole grains, lean proteins, low-fat dairy, and heart-healthy fats.  Work with your health care provider or diet and nutrition specialist (dietitian) to adjust your eating plan to your   individual calorie needs. This information is not intended to replace advice given to you by your health care provider. Make sure you discuss any questions you have with your health care provider. Document Revised: 01/16/2017 Document Reviewed: 01/28/2016 Elsevier Patient Education  2020 Elsevier Inc.  

## 2019-04-28 ENCOUNTER — Ambulatory Visit: Payer: Self-pay | Admitting: Pharmacy Technician

## 2019-04-28 ENCOUNTER — Other Ambulatory Visit: Payer: Self-pay

## 2019-04-28 DIAGNOSIS — Z79899 Other long term (current) drug therapy: Secondary | ICD-10-CM

## 2019-04-28 NOTE — Progress Notes (Signed)
Completed [facility] application.  Patient agreed to all terms of the [facility] contract.  Mailing contract to patient to sign and return to [facility].  Patient approved to receive medication assistance at [facility] until time for re-certification in 6384, and as long as eligibility criteria continues to be met.    Provided patient with community resource material based on her particular needs.    [place]

## 2019-04-29 ENCOUNTER — Telehealth: Payer: Self-pay | Admitting: Gastroenterology

## 2019-04-29 ENCOUNTER — Other Ambulatory Visit: Payer: Self-pay

## 2019-04-29 DIAGNOSIS — Z1211 Encounter for screening for malignant neoplasm of colon: Secondary | ICD-10-CM

## 2019-04-29 DIAGNOSIS — Z8371 Family history of colonic polyps: Secondary | ICD-10-CM

## 2019-04-29 NOTE — Telephone Encounter (Signed)
Gastroenterology Pre-Procedure Review  Request Date: Monday 05/23/19 Requesting Physician: Dr. Tobi Bastos  PATIENT REVIEW QUESTIONS: The patient responded to the following health history questions as indicated:    1. Are you having any GI issues? no 2. Do you have a personal history of Polyps? no 3. Do you have a family history of Colon Cancer or Polyps? yes (father colon polyps) 4. Diabetes Mellitus? no 5. Joint replacements in the past 12 months?no 6. Major health problems in the past 3 months?no 7. Any artificial heart valves, MVP, or defibrillator?no    MEDICATIONS & ALLERGIES:    Patient reports the following regarding taking any anticoagulation/antiplatelet therapy:   Plavix, Coumadin, Eliquis, Xarelto, Lovenox, Pradaxa, Brilinta, or Effient? no Aspirin? no  Patient confirms/reports the following medications:  Current Outpatient Medications  Medication Sig Dispense Refill  . albuterol (VENTOLIN HFA) 108 (90 Base) MCG/ACT inhaler Inhale 1-2 puffs into the lungs every 6 (six) hours as needed for wheezing or shortness of breath. (Patient not taking: Reported on 04/21/2019) 18 g 3  . BLACK ELDERBERRY PO Take by mouth.    . calcium-vitamin D (OSCAL WITH D) 500-200 MG-UNIT tablet Take 1 tablet by mouth.    Melene Muller ON 05/25/2019] hydrochlorothiazide (HYDRODIURIL) 25 MG tablet TAKE ONE TABLET BY MOUTH EVERY DAY 30 tablet 3  . triamcinolone (KENALOG) 0.025 % cream Apply 1 application topically as needed. (Patient not taking: Reported on 04/21/2019) 30 g 1   No current facility-administered medications for this visit.    Patient confirms/reports the following allergies:  No Known Allergies  No orders of the defined types were placed in this encounter.   AUTHORIZATION INFORMATION Primary Insurance: 1D#: Group #:  Secondary Insurance: 1D#: Group #:  SCHEDULE INFORMATION: Date: Monday 05/23/19 Time: Location:[facility]

## 2019-04-29 NOTE — Telephone Encounter (Signed)
Patient called & l/m on v/m for [facility]. She states the best days for her are 3-23 or 3-25 but prefers the 23rd. Please call her @ [phone] to schedule.

## 2019-05-19 ENCOUNTER — Telehealth: Payer: Self-pay

## 2019-05-19 ENCOUNTER — Other Ambulatory Visit: Payer: Self-pay

## 2019-05-19 ENCOUNTER — Other Ambulatory Visit
Admission: RE | Admit: 2019-05-19 | Discharge: 2019-05-19 | Disposition: A | Discharge status: Home / Self Care | Payer: HRSA Program | Source: Ambulatory Visit | Attending: Gastroenterology | Admitting: Gastroenterology

## 2019-05-19 DIAGNOSIS — Z01812 Encounter for preprocedural laboratory examination: Secondary | ICD-10-CM | POA: Diagnosis present

## 2019-05-19 DIAGNOSIS — Z20822 Contact with and (suspected) exposure to covid-19: Secondary | ICD-10-CM | POA: Insufficient documentation

## 2019-05-19 LAB — SARS CORONAVIRUS 2 (TAT 6-24 HRS): SARS Coronavirus 2: NEGATIVE

## 2019-05-19 NOTE — Telephone Encounter (Signed)
Returned patients call.  Informed her that I've called in Golytely bowel prep to [facility] on [address]  She has been advised to follow the mixing instructions for preparation.  Patient has been asked to begin drinking the prep at 5pm.  Drink 8 oz every 30 minutes until she completes the entire contents.  Nothing to eat or drink 4 hours prior to procedure.  Stay well hydrated throughout the day.  Thanks,  [place], [place]

## 2019-05-23 ENCOUNTER — Other Ambulatory Visit: Payer: Self-pay

## 2019-05-23 ENCOUNTER — Encounter
Admission: RE | Disposition: A | Discharge status: Home / Self Care | Payer: Self-pay | Source: Home / Self Care | Attending: Gastroenterology

## 2019-05-23 ENCOUNTER — Ambulatory Visit
Admission: RE | Admit: 2019-05-23 | Discharge: 2019-05-23 | Disposition: A | Discharge status: Home / Self Care | Payer: Self-pay | Attending: Gastroenterology | Admitting: Gastroenterology

## 2019-05-23 ENCOUNTER — Encounter: Payer: Self-pay | Admitting: Gastroenterology

## 2019-05-23 ENCOUNTER — Ambulatory Visit: Payer: Self-pay | Admitting: Anesthesiology

## 2019-05-23 DIAGNOSIS — I10 Essential (primary) hypertension: Secondary | ICD-10-CM | POA: Insufficient documentation

## 2019-05-23 DIAGNOSIS — Z87891 Personal history of nicotine dependence: Secondary | ICD-10-CM | POA: Insufficient documentation

## 2019-05-23 DIAGNOSIS — Z79899 Other long term (current) drug therapy: Secondary | ICD-10-CM | POA: Insufficient documentation

## 2019-05-23 DIAGNOSIS — Z1211 Encounter for screening for malignant neoplasm of colon: Secondary | ICD-10-CM | POA: Insufficient documentation

## 2019-05-23 DIAGNOSIS — K5730 Diverticulosis of large intestine without perforation or abscess without bleeding: Secondary | ICD-10-CM | POA: Insufficient documentation

## 2019-05-23 DIAGNOSIS — Z8371 Family history of colonic polyps: Secondary | ICD-10-CM | POA: Insufficient documentation

## 2019-05-23 DIAGNOSIS — J45909 Unspecified asthma, uncomplicated: Secondary | ICD-10-CM | POA: Insufficient documentation

## 2019-05-23 HISTORY — PX: COLONOSCOPY WITH PROPOFOL: SHX5780

## 2019-05-23 SURGERY — COLONOSCOPY WITH PROPOFOL
Anesthesia: General

## 2019-05-23 MED ORDER — PROPOFOL 500 MG/50ML IV EMUL
INTRAVENOUS | Status: DC | PRN
  Administered 2019-05-23: 50 ug/kg/min via INTRAVENOUS

## 2019-05-23 MED ORDER — PROPOFOL 500 MG/50ML IV EMUL
INTRAVENOUS | Status: AC
  Filled 2019-05-23: qty 50

## 2019-05-23 MED ORDER — FENTANYL CITRATE (PF) 100 MCG/2ML IJ SOLN
INTRAMUSCULAR | Status: AC
  Filled 2019-05-23: qty 2

## 2019-05-23 MED ORDER — SODIUM CHLORIDE 0.9 % IV SOLN
INTRAVENOUS | Status: DC
  Administered 2019-05-23: 1000 mL via INTRAVENOUS

## 2019-05-23 MED ORDER — MIDAZOLAM HCL 5 MG/5ML IJ SOLN
INTRAMUSCULAR | Status: DC | PRN
  Administered 2019-05-23: 2 mg via INTRAVENOUS

## 2019-05-23 MED ORDER — FENTANYL CITRATE (PF) 100 MCG/2ML IJ SOLN
INTRAMUSCULAR | Status: DC | PRN
  Administered 2019-05-23 (×2): 50 ug via INTRAVENOUS

## 2019-05-23 MED ORDER — LIDOCAINE HCL (PF) 2 % IJ SOLN
INTRAMUSCULAR | Status: AC
  Filled 2019-05-23: qty 10

## 2019-05-23 MED ORDER — PROPOFOL 10 MG/ML IV BOLUS
INTRAVENOUS | Status: DC | PRN
  Administered 2019-05-23: 30 mg via INTRAVENOUS
  Administered 2019-05-23: 20 mg via INTRAVENOUS

## 2019-05-23 MED ORDER — MIDAZOLAM HCL 2 MG/2ML IJ SOLN
INTRAMUSCULAR | Status: AC
  Filled 2019-05-23: qty 2

## 2019-05-23 MED ORDER — LIDOCAINE HCL (PF) 2 % IJ SOLN
INTRAMUSCULAR | Status: DC | PRN
  Administered 2019-05-23: 100 mg

## 2019-05-23 NOTE — Anesthesia Preprocedure Evaluation (Signed)
Anesthesia Evaluation  Patient identified by MRN, date of birth, ID band Patient awake    Reviewed: Allergy & Precautions, H&P , NPO status , Patient's Chart, lab work & pertinent test results, reviewed documented beta blocker date and time   Airway Mallampati: II   Neck ROM: full    Dental  (+) Poor Dentition   Pulmonary neg pulmonary ROS, asthma , former smoker,    Pulmonary exam normal        Cardiovascular Exercise Tolerance: Good hypertension, On Medications negative cardio ROS Normal cardiovascular exam Rhythm:regular Rate:Normal     Neuro/Psych negative neurological ROS  negative psych ROS   GI/Hepatic negative GI ROS, Neg liver ROS,   Endo/Other  negative endocrine ROS  Renal/GU negative Renal ROS  negative genitourinary   Musculoskeletal   Abdominal   Peds  Hematology negative hematology ROS (+)   Anesthesia Other Findings Past Medical History: No date: Allergy No date: Asthma No date: Hypertension Past Surgical History: No date: CESAREAN SECTION No date: COLONOSCOPY WITH PROPOFOL No date: CYST EXCISION BMI    Body Mass Index: 40.34 kg/m     Reproductive/Obstetrics negative OB ROS                             Anesthesia Physical Anesthesia Plan  ASA: III  Anesthesia Plan: General   Post-op Pain Management:    Induction:   PONV Risk Score and Plan:   Airway Management Planned:   Additional Equipment:   Intra-op Plan:   Post-operative Plan:   Informed Consent: I have reviewed the patients History and Physical, chart, labs and discussed the procedure including the risks, benefits and alternatives for the proposed anesthesia with the patient or authorized representative who has indicated his/her understanding and acceptance.     Dental Advisory Given  Plan Discussed with: CRNA  Anesthesia Plan Comments:         Anesthesia Quick Evaluation

## 2019-05-23 NOTE — Op Note (Signed)
[facility] Gastroenterology Patient Name: Crystal Hays Procedure Date: 05/23/2019 8:51 AM MRN: [id number] Account #: [id number] Date of Birth: 09-Aug-1960 Admit Type: Outpatient Age: 59 Room: [facility] ENDO ROOM 4 Gender: Female Note Status: Finalized Procedure:             Colonoscopy Indications:           Colon cancer screening in patient at increased risk:                         Family history of 1st-degree relative with colon                         polyps before age 16 years Providers:             Jonathon Bellows MD, MD Referring MD:          Tania Ade (Referring MD) Medicines:             Monitored Anesthesia Care Complications:         No immediate complications. Procedure:             Pre-Anesthesia Assessment:                        - Prior to the procedure, a History and Physical was                         performed, and patient medications, allergies and                         sensitivities were reviewed. The patient's tolerance                         of previous anesthesia was reviewed.                        - The risks and benefits of the procedure and the                         sedation options and risks were discussed with the                         patient. All questions were answered and informed                         consent was obtained.                        - ASA Grade Assessment: II - A patient with mild                         systemic disease.                        After obtaining informed consent, the colonoscope was                         passed under direct vision. Throughout the procedure,                         the patient's blood pressure,  pulse, and oxygen                         saturations were monitored continuously. The                         Colonoscope was introduced through the anus and                         advanced to the the cecum, identified by the                         appendiceal orifice. The  colonoscopy was performed                         with ease. The patient tolerated the procedure well.                         The quality of the bowel preparation was excellent. Findings:      The perianal and digital rectal examinations were normal.      The entire examined colon appeared normal on direct and retroflexion       views.      A few small-mouthed diverticula were found in the entire colon. Impression:            - The entire examined colon is normal on direct and                         retroflexion views.                        - Diverticulosis in the entire examined colon.                        - No specimens collected. Recommendation:        - Discharge patient to home (with escort).                        - Resume previous diet.                        - Continue present medications.                        - Repeat colonoscopy in 5 years for surveillance. Procedure Code(s):     --- Professional ---                        [phone], Colonoscopy, flexible; diagnostic, including                         collection of specimen(s) by brushing or washing, when                         performed (separate procedure) Diagnosis Code(s):     --- Professional ---                        Z83.71, Family history of colonic polyps  K57.30, Diverticulosis of large intestine without                         perforation or abscess without bleeding CPT copyright 2019 American Medical Association. All rights reserved. The codes documented in this report are preliminary and upon coder review may  be revised to meet current compliance requirements. Wyline Mood, MD Wyline Mood MD, MD 05/23/2019 9:10:51 AM This report has been signed electronically. Number of Addenda: 0 Note Initiated On: 05/23/2019 8:51 AM Scope Withdrawal Time: 0 hours 10 minutes 3 seconds  Total Procedure Duration: 0 hours 12 minutes 40 seconds  Estimated Blood Loss:  Estimated blood loss: none.       [facility]

## 2019-05-23 NOTE — Anesthesia Postprocedure Evaluation (Signed)
Anesthesia Post Note  Patient: Crystal Hays  Procedure(s) Performed: COLONOSCOPY WITH PROPOFOL (N/A )  Patient location during evaluation: PACU Anesthesia Type: General Level of consciousness: awake and alert Pain management: pain level controlled Vital Signs Assessment: post-procedure vital signs reviewed and stable Respiratory status: spontaneous breathing, nonlabored ventilation and respiratory function stable Cardiovascular status: blood pressure returned to baseline and stable Postop Assessment: no apparent nausea or vomiting Anesthetic complications: no     Last Vitals:  Vitals:   05/23/19 0933 05/23/19 0943  BP: (!) 149/85 134/65  Pulse: 73 72  Resp: 19 20  Temp:    SpO2: 100% 99%    Last Pain:  Vitals:   05/23/19 0943  TempSrc:   PainSc: 0-No pain                 Karleen Hampshire

## 2019-05-23 NOTE — Transfer of Care (Signed)
Immediate Anesthesia Transfer of Care Note  Patient: Crystal Hays  Procedure(s) Performed: COLONOSCOPY WITH PROPOFOL (N/A )  Patient Location: PACU  Anesthesia Type:General  Level of Consciousness: awake, alert  and oriented  Airway & Oxygen Therapy: Patient Spontanous Breathing  Post-op Assessment: Report given to RN and Post -op Vital signs reviewed and stable  Post vital signs: Reviewed and stable  Last Vitals:  Vitals Value Taken Time  BP    Temp    Pulse 76 05/23/19 0913  Resp 17 05/23/19 0913  SpO2 100 % 05/23/19 0913  Vitals shown include unvalidated device data.  Last Pain:  Vitals:   05/23/19 0913  TempSrc: (P) Temporal  PainSc:          Complications: No apparent anesthesia complications

## 2019-05-23 NOTE — H&P (Signed)
Crystal Mood, MD [address], [place], [place], [address] [address], [place], [place], [address] Phone: [phone]  Fax: [phone]  Primary Care Physician:  Judeen Hammans, MD   Pre-Procedure History & Physical: HPI:  Crystal Hays is a 59 y.o. female is here for an colonoscopy.   Past Medical History:  Diagnosis Date  . Allergy   . Asthma   . Hypertension     Past Surgical History:  Procedure Laterality Date  . CESAREAN SECTION    . COLONOSCOPY WITH PROPOFOL    . CYST EXCISION      Prior to Admission medications   Medication Sig Start Date End Date Taking? Authorizing Provider  hydrochlorothiazide (HYDRODIURIL) 25 MG tablet TAKE ONE TABLET BY MOUTH EVERY DAY 05/25/19  Yes Iloabachie, Chioma E, NP  albuterol (VENTOLIN HFA) 108 (90 Base) MCG/ACT inhaler Inhale 1-2 puffs into the lungs every 6 (six) hours as needed for wheezing or shortness of breath. Patient not taking: Reported on 04/21/2019 08/19/18   Doles-Johnson, Teah, NP  BLACK ELDERBERRY PO Take by mouth.    [provider]  calcium-vitamin D (OSCAL WITH D) 500-200 MG-UNIT tablet Take 1 tablet by mouth.    [provider]  triamcinolone (KENALOG) 0.025 % cream Apply 1 application topically as needed. Patient not taking: Reported on 04/21/2019 02/24/19   Mike Gip, FNP    Allergies as of 04/29/2019  . (No Known Allergies)    Family History  Problem Relation Age of Onset  . Heart disease Father   . Prostate cancer Father   . Diabetes Maternal Aunt   . Breast cancer Neg Hx     Social History   Socioeconomic History  . Marital status: Legally Separated    Spouse name: Not on file  . Number of children: Not on file  . Years of education: Not on file  . Highest education level: Not on file  Occupational History  . Not on file  Tobacco Use  . Smoking status: Former Smoker    Types: Cigarettes    Quit date: 2004    Years since quitting: 17.2  .  Smokeless tobacco: Never Used  . Tobacco comment: quit  Substance and Sexual Activity  . Alcohol use: Yes    Comment: occasionally  . Drug use: No  . Sexual activity: Not Currently  Other Topics Concern  . Not on file  Social History Narrative   Lives with son and is well taken care of      Asked about a braces program so I told her about dental and she had already been seen there.      --After I explained the program, patient said she did not need anything from it at this time and did not want to participate. No consent form signed.    Social Determinants of Health   Financial Resource Strain:   . Difficulty of Paying Living Expenses:   Food Insecurity: Unknown  . Worried About Programme researcher, broadcasting/film/video in the Last Year: Not on file  . Ran Out of Food in the Last Year: Never true  Transportation Needs: No Transportation Needs  . Lack of Transportation (Medical): No  . Lack of Transportation (Non-Medical): No  Physical Activity:   . Days of Exercise per Week:   . Minutes of Exercise per Session:   Stress:   . Feeling of Stress :   Social Connections:   . Frequency of Communication with Friends  and Family:   . Frequency of Social Gatherings with Friends and Family:   . Attends Religious Services:   . Active Member of Clubs or Organizations:   . Attends Archivist Meetings:   Marland Kitchen Marital Status:   Intimate Partner Violence:   . Fear of Current or Ex-Partner:   . Emotionally Abused:   Marland Kitchen Physically Abused:   . Sexually Abused:     Review of Systems: See HPI, otherwise negative ROS  Physical Exam: BP (!) 154/97   Pulse 81   Temp (!) 97.4 F (36.3 C) (Tympanic)   Resp 18   Ht 5\' 4"  (1.626 m)   Wt 106.6 kg   LMP 06/25/2014 (Approximate)   SpO2 98%   BMI 40.34 kg/m  General:   Alert,  pleasant and cooperative in NAD Head:  Normocephalic and atraumatic. Neck:  Supple; no masses or thyromegaly. Lungs:  Clear throughout to auscultation, normal respiratory effort.     Heart:  +S1, +S2, Regular rate and rhythm, No edema. Abdomen:  Soft, nontender and nondistended. Normal bowel sounds, without guarding, and without rebound.   Neurologic:  Alert and  oriented x4;  grossly normal neurologically.  Impression/Plan: Crystal Hays is here for an colonoscopy to be performed for Screening colonoscopy father had colon polyps in his 78's Risks, benefits, limitations, and alternatives regarding  colonoscopy have been reviewed with the patient.  Questions have been answered.  All parties agreeable.   Jonathon Bellows, MD  05/23/2019, 8:34 AM

## 2019-05-24 ENCOUNTER — Encounter: Payer: Self-pay | Admitting: *Deleted

## 2019-05-25 ENCOUNTER — Other Ambulatory Visit: Payer: Self-pay

## 2019-05-25 DIAGNOSIS — R7303 Prediabetes: Secondary | ICD-10-CM

## 2019-05-25 DIAGNOSIS — Z0000 Encounter for general adult medical examination without abnormal findings: Secondary | ICD-10-CM

## 2019-05-25 DIAGNOSIS — I10 Essential (primary) hypertension: Secondary | ICD-10-CM

## 2019-05-26 LAB — CBC WITH DIFFERENTIAL
Basophils Absolute: 0 10*3/uL (ref 0.0–0.2)
Basos: 1 %
EOS (ABSOLUTE): 0.3 10*3/uL (ref 0.0–0.4)
Eos: 8 %
Hematocrit: 43.2 % (ref 34.0–46.6)
Hemoglobin: 14.3 g/dL (ref 11.1–15.9)
Immature Grans (Abs): 0 10*3/uL (ref 0.0–0.1)
Immature Granulocytes: 0 %
Lymphocytes Absolute: 1.9 10*3/uL (ref 0.7–3.1)
Lymphs: 45 %
MCH: 28.8 pg (ref 26.6–33.0)
MCHC: 33.1 g/dL (ref 31.5–35.7)
MCV: 87 fL (ref 79–97)
Monocytes Absolute: 0.4 10*3/uL (ref 0.1–0.9)
Monocytes: 10 %
Neutrophils Absolute: 1.5 10*3/uL (ref 1.4–7.0)
Neutrophils: 36 %
RBC: 4.97 x10E6/uL (ref 3.77–5.28)
RDW: 12.6 % (ref 11.7–15.4)
WBC: 4.1 10*3/uL (ref 3.4–10.8)

## 2019-05-26 LAB — COMPREHENSIVE METABOLIC PANEL
ALT: 13 IU/L (ref 0–32)
AST: 17 IU/L (ref 0–40)
Albumin/Globulin Ratio: 1.6 (ref 1.2–2.2)
Albumin: 4.5 g/dL (ref 3.8–4.9)
Alkaline Phosphatase: 64 IU/L (ref 39–117)
BUN/Creatinine Ratio: 25 — ABNORMAL HIGH (ref 9–23)
BUN: 17 mg/dL (ref 6–24)
Bilirubin Total: 0.4 mg/dL (ref 0.0–1.2)
CO2: 28 mmol/L (ref 20–29)
Calcium: 9.6 mg/dL (ref 8.7–10.2)
Chloride: 100 mmol/L (ref 96–106)
Creatinine, Ser: 0.69 mg/dL (ref 0.57–1.00)
GFR calc Af Amer: 110 mL/min/{1.73_m2} (ref >59)
GFR calc non Af Amer: 96 mL/min/{1.73_m2} (ref >59)
Globulin, Total: 2.9 g/dL (ref 1.5–4.5)
Glucose: 88 mg/dL (ref 65–99)
Potassium: 3.9 mmol/L (ref 3.5–5.2)
Sodium: 143 mmol/L (ref 134–144)
Total Protein: 7.4 g/dL (ref 6.0–8.5)

## 2019-05-26 LAB — LIPID PANEL WITH LDL/HDL RATIO
Cholesterol, Total: 186 mg/dL (ref 100–199)
HDL: 81 mg/dL (ref >39)
LDL Chol Calc (NIH): 94 mg/dL (ref 0–99)
LDL/HDL Ratio: 1.2 ratio (ref 0.0–3.2)
Triglycerides: 60 mg/dL (ref 0–149)
VLDL Cholesterol Cal: 11 mg/dL (ref 5–40)

## 2019-05-26 LAB — HEMOGLOBIN A1C
Est. average glucose Bld gHb Est-mCnc: 108 mg/dL
Hgb A1c MFr Bld: 5.4 % (ref 4.8–5.6)

## 2019-07-27 ENCOUNTER — Other Ambulatory Visit: Payer: Self-pay

## 2019-07-27 ENCOUNTER — Other Ambulatory Visit: Payer: Self-pay | Admitting: Gerontology

## 2019-07-27 ENCOUNTER — Encounter: Payer: Self-pay | Admitting: Gerontology

## 2019-07-27 ENCOUNTER — Ambulatory Visit: Payer: Self-pay | Admitting: Gerontology

## 2019-07-27 VITALS — BP 151/96 | HR 74 | Ht 64.0 in | Wt 242.0 lb

## 2019-07-27 DIAGNOSIS — I10 Essential (primary) hypertension: Secondary | ICD-10-CM

## 2019-07-27 MED ORDER — HYDROCHLOROTHIAZIDE 25 MG PO TABS: ORAL_TABLET | ORAL | 3 refills | Status: DC

## 2019-07-27 NOTE — Progress Notes (Signed)
Established Patient Office Visit  Subjective:  Patient ID: Crystal Hays, female    DOB: Aug 25, 1960  Age: 59 y.o. MRN: [id number]  CC:  Chief Complaint  Patient presents with  . Hypertension    HPI Crystal Hays presents for follow up of hypertension and medication reffill. Patient is compliant with her medications and makes life style modifications,and  follows the DASH diet. She checks her blood pressure twice a week, and average BP 138-140/88. She walks daily for 30 minutes twice a day. She had colonoscopy on 05/23/2019 by Dr. Jonathon Bellows and the entire examined colon is normal on direct and retroflexion views. Diverticulosis in the entire examined colon.  No specimens collected. Recommended repeat colonoscopy in 5 years. Overall she states she is doing well and offers no further complaint.  Past Medical History:  Diagnosis Date  . Allergy   . Asthma   . Hypertension     Past Surgical History:  Procedure Laterality Date  . CESAREAN SECTION    . COLONOSCOPY WITH PROPOFOL    . COLONOSCOPY WITH PROPOFOL N/A 05/23/2019   Procedure: COLONOSCOPY WITH PROPOFOL;  Surgeon: Jonathon Bellows, MD;  Location: [facility] ENDOSCOPY;  Service: Gastroenterology;  Laterality: N/A;  PRIORITY 4  . CYST EXCISION      Family History  Problem Relation Age of Onset  . Heart disease Father   . Prostate cancer Father   . Diabetes Maternal Aunt   . Breast cancer Neg Hx     Social History   Socioeconomic History  . Marital status: Legally Separated    Spouse name: Not on file  . Number of children: Not on file  . Years of education: Not on file  . Highest education level: Not on file  Occupational History  . Not on file  Tobacco Use  . Smoking status: Former Smoker    Types: Cigarettes    Quit date: 2004    Years since quitting: 17.4  . Smokeless tobacco: Never Used  . Tobacco comment: quit  Substance and Sexual Activity  . Alcohol use: Yes    Comment: occasionally  . Drug use: No  . Sexual  activity: Not Currently  Other Topics Concern  . Not on file  Social History Narrative   Lives with son and is well taken care of      Asked about a braces program so I told her about dental and she had already been seen there.      --After I explained the program, patient said she did not need anything from it at this time and did not want to participate. No consent form signed.    Social Determinants of Health   Financial Resource Strain: Medium Risk  . Difficulty of Paying Living Expenses: Somewhat hard  Food Insecurity: No Food Insecurity  . Worried About Charity fundraiser in the Last Year: Never true  . Ran Out of Food in the Last Year: Never true  Transportation Needs: No Transportation Needs  . Lack of Transportation (Medical): No  . Lack of Transportation (Non-Medical): No  Physical Activity: Insufficiently Active  . Days of Exercise per Week: 7 days  . Minutes of Exercise per Session: 20 min  Stress:   . Feeling of Stress :   Social Connections: Slightly Isolated  . Frequency of Communication with Friends and Family: More than three times a week  . Frequency of Social Gatherings with Friends and Family: More than three times a week  . Attends Religious Services:  1 to 4 times per year  . Active Member of Clubs or Organizations: Yes  . Attends Banker Meetings: 1 to 4 times per year  . Marital Status: Separated  Intimate Partner Violence: Not At Risk  . Fear of Current or Ex-Partner: No  . Emotionally Abused: No  . Physically Abused: No  . Sexually Abused: No    Outpatient Medications Prior to Visit  Medication Sig Dispense Refill  . aspirin EC 81 MG tablet Take 81 mg by mouth daily.    Marland Kitchen BLACK ELDERBERRY PO Take by mouth.    . calcium-vitamin D (OSCAL WITH D) 500-200 MG-UNIT tablet Take 1 tablet by mouth.    . hydrochlorothiazide (HYDRODIURIL) 25 MG tablet TAKE ONE TABLET BY MOUTH EVERY DAY 30 tablet 3  . albuterol (VENTOLIN HFA) 108 (90 Base)  MCG/ACT inhaler Inhale 1-2 puffs into the lungs every 6 (six) hours as needed for wheezing or shortness of breath. (Patient not taking: Reported on 04/21/2019) 18 g 3  . triamcinolone (KENALOG) 0.025 % cream Apply 1 application topically as needed. (Patient not taking: Reported on 04/21/2019) 30 g 1   No facility-administered medications prior to visit.    No Known Allergies  ROS Review of Systems  Constitutional: Negative.   Eyes: Negative.   Respiratory: Negative.   Cardiovascular: Negative.   Neurological: Negative.   Psychiatric/Behavioral: Negative.       Objective:    Physical Exam  Constitutional: She is oriented to person, place, and time. She appears well-developed and well-nourished.  HENT:  Head: Normocephalic.  Eyes: Pupils are equal, round, and reactive to light. EOM are normal.  Cardiovascular: Normal rate.  Pulmonary/Chest: Effort normal.  Neurological: She is alert and oriented to person, place, and time.  Skin: Skin is warm and dry.  Psychiatric: She has a normal mood and affect.    BP (!) 151/96 (BP Location: Left Arm, Patient Position: Sitting, Cuff Size: Large)   Pulse 74   Ht 5\' 4"  (1.626 m)   Wt 242 lb (109.8 kg)   LMP 06/25/2014 (Approximate)   SpO2 94%   BMI 41.54 kg/m  Wt Readings from Last 3 Encounters:  07/27/19 242 lb (109.8 kg)  05/25/19 235 lb 12.8 oz (107 kg)  05/23/19 235 lb (106.6 kg)  encouraged to continue her weight loss regimen.  Health Maintenance Due  Topic Date Due  . Hepatitis C Screening  Never done  . HIV Screening  Never done  . TETANUS/TDAP  Never done    There are no preventive care reminders to display for this patient.  Lab Results  Component Value Date   TSH 0.833 06/23/2017   Lab Results  Component Value Date   WBC 4.1 05/25/2019   HGB 14.3 05/25/2019   HCT 43.2 05/25/2019   MCV 87 05/25/2019   PLT 266 08/05/2018   Lab Results  Component Value Date   NA 143 05/25/2019   K 3.9 05/25/2019   CO2 28  05/25/2019   GLUCOSE 88 05/25/2019   BUN 17 05/25/2019   CREATININE 0.69 05/25/2019   BILITOT 0.4 05/25/2019   ALKPHOS 64 05/25/2019   AST 17 05/25/2019   ALT 13 05/25/2019   PROT 7.4 05/25/2019   ALBUMIN 4.5 05/25/2019   CALCIUM 9.6 05/25/2019   Lab Results  Component Value Date   CHOL 186 05/25/2019   Lab Results  Component Value Date   HDL 81 05/25/2019   Lab Results  Component Value Date   LDLCALC  94 05/25/2019   Lab Results  Component Value Date   TRIG 60 05/25/2019   Lab Results  Component Value Date   CHOLHDL 2.7 08/05/2018   Lab Results  Component Value Date   HGBA1C 5.4 05/25/2019      Assessment & Plan:     1. Essential hypertension - her blood pressure is not controlled. Her goal BP should be less than 140/90 - She will continue current medication, was advised to continue to DASH diet and exercise as tolerated. hydrochlorothiazide (HYDRODIURIL) 25 MG tablet; TAKE ONE TABLET BY MOUTH EVERY DAY  Dispense: 30 tablet; Refill: 3   Follow-up: Return in about 3 months (around 10/27/2019), or if symptoms worsen or fail to improve.    Dwayne Begay Trellis Paganini, NP

## 2019-07-27 NOTE — Patient Instructions (Signed)
DASH Eating Plan DASH stands for "Dietary Approaches to Stop Hypertension." The DASH eating plan is a healthy eating plan that has been shown to reduce high blood pressure (hypertension). It may also reduce your risk for type 2 diabetes, heart disease, and stroke. The DASH eating plan may also help with weight loss. What are tips for following this plan?  General guidelines  Avoid eating more than 2,300 mg (milligrams) of salt (sodium) a day. If you have hypertension, you may need to reduce your sodium intake to 1,500 mg a day.  Limit alcohol intake to no more than 1 drink a day for nonpregnant women and 2 drinks a day for men. One drink equals 12 oz of beer, 5 oz of wine, or 1 oz of hard liquor.  Work with your health care provider to maintain a healthy body weight or to lose weight. Ask what an ideal weight is for you.  Get at least 30 minutes of exercise that causes your heart to beat faster (aerobic exercise) most days of the week. Activities may include walking, swimming, or biking.  Work with your health care provider or diet and nutrition specialist (dietitian) to adjust your eating plan to your individual calorie needs. Reading food labels   Check food labels for the amount of sodium per serving. Choose foods with less than 5 percent of the Daily Value of sodium. Generally, foods with less than 300 mg of sodium per serving fit into this eating plan.  To find whole grains, look for the word "whole" as the first word in the ingredient list. Shopping  Buy products labeled as "low-sodium" or "no salt added."  Buy fresh foods. Avoid canned foods and premade or frozen meals. Cooking  Avoid adding salt when cooking. Use salt-free seasonings or herbs instead of table salt or sea salt. Check with your health care provider or pharmacist before using salt substitutes.  Do not fry foods. Cook foods using healthy methods such as baking, boiling, grilling, and broiling instead.  Cook with  heart-healthy oils, such as olive, canola, soybean, or sunflower oil. Meal planning  Eat a balanced diet that includes: ? 5 or more servings of fruits and vegetables each day. At each meal, try to fill half of your plate with fruits and vegetables. ? Up to 6-8 servings of whole grains each day. ? Less than 6 oz of lean meat, poultry, or fish each day. A 3-oz serving of meat is about the same size as a deck of cards. One egg equals 1 oz. ? 2 servings of low-fat dairy each day. ? A serving of nuts, seeds, or beans 5 times each week. ? Heart-healthy fats. Healthy fats called Omega-3 fatty acids are found in foods such as flaxseeds and coldwater fish, like sardines, salmon, and mackerel.  Limit how much you eat of the following: ? Canned or prepackaged foods. ? Food that is high in trans fat, such as fried foods. ? Food that is high in saturated fat, such as fatty meat. ? Sweets, desserts, sugary drinks, and other foods with added sugar. ? Full-fat dairy products.  Do not salt foods before eating.  Try to eat at least 2 vegetarian meals each week.  Eat more home-cooked food and less restaurant, buffet, and fast food.  When eating at a restaurant, ask that your food be prepared with less salt or no salt, if possible. What foods are recommended? The items listed may not be a complete list. Talk with your dietitian about   what dietary choices are best for you. Grains Whole-grain or whole-wheat bread. Whole-grain or whole-wheat pasta. Brown rice. Oatmeal. Quinoa. Bulgur. Whole-grain and low-sodium cereals. Pita bread. Low-fat, low-sodium crackers. Whole-wheat flour tortillas. Vegetables Fresh or frozen vegetables (raw, steamed, roasted, or grilled). Low-sodium or reduced-sodium tomato and vegetable juice. Low-sodium or reduced-sodium tomato sauce and tomato paste. Low-sodium or reduced-sodium canned vegetables. Fruits All fresh, dried, or frozen fruit. Canned fruit in natural juice (without  added sugar). Meat and other protein foods Skinless chicken or turkey. Ground chicken or turkey. Pork with fat trimmed off. Fish and seafood. Egg whites. Dried beans, peas, or lentils. Unsalted nuts, nut butters, and seeds. Unsalted canned beans. Lean cuts of beef with fat trimmed off. Low-sodium, lean deli meat. Dairy Low-fat (1%) or fat-free (skim) milk. Fat-free, low-fat, or reduced-fat cheeses. Nonfat, low-sodium ricotta or cottage cheese. Low-fat or nonfat yogurt. Low-fat, low-sodium cheese. Fats and oils Soft margarine without trans fats. Vegetable oil. Low-fat, reduced-fat, or light mayonnaise and salad dressings (reduced-sodium). Canola, safflower, olive, soybean, and sunflower oils. Avocado. Seasoning and other foods Herbs. Spices. Seasoning mixes without salt. Unsalted popcorn and pretzels. Fat-free sweets. What foods are not recommended? The items listed may not be a complete list. Talk with your dietitian about what dietary choices are best for you. Grains Baked goods made with fat, such as croissants, muffins, or some breads. Dry pasta or rice meal packs. Vegetables Creamed or fried vegetables. Vegetables in a cheese sauce. Regular canned vegetables (not low-sodium or reduced-sodium). Regular canned tomato sauce and paste (not low-sodium or reduced-sodium). Regular tomato and vegetable juice (not low-sodium or reduced-sodium). Pickles. Olives. Fruits Canned fruit in a light or heavy syrup. Fried fruit. Fruit in cream or butter sauce. Meat and other protein foods Fatty cuts of meat. Ribs. Fried meat. Bacon. Sausage. Bologna and other processed lunch meats. Salami. Fatback. Hotdogs. Bratwurst. Salted nuts and seeds. Canned beans with added salt. Canned or smoked fish. Whole eggs or egg yolks. Chicken or turkey with skin. Dairy Whole or 2% milk, cream, and half-and-half. Whole or full-fat cream cheese. Whole-fat or sweetened yogurt. Full-fat cheese. Nondairy creamers. Whipped toppings.  Processed cheese and cheese spreads. Fats and oils Butter. Stick margarine. Lard. Shortening. Ghee. Bacon fat. Tropical oils, such as coconut, palm kernel, or palm oil. Seasoning and other foods Salted popcorn and pretzels. Onion salt, garlic salt, seasoned salt, table salt, and sea salt. Worcestershire sauce. Tartar sauce. Barbecue sauce. Teriyaki sauce. Soy sauce, including reduced-sodium. Steak sauce. Canned and packaged gravies. Fish sauce. Oyster sauce. Cocktail sauce. Horseradish that you find on the shelf. Ketchup. Mustard. Meat flavorings and tenderizers. Bouillon cubes. Hot sauce and Tabasco sauce. Premade or packaged marinades. Premade or packaged taco seasonings. Relishes. Regular salad dressings. Where to find more information:  National Heart, Lung, and Blood Institute: www.nhlbi.nih.gov  American Heart Association: www.heart.org Summary  The DASH eating plan is a healthy eating plan that has been shown to reduce high blood pressure (hypertension). It may also reduce your risk for type 2 diabetes, heart disease, and stroke.  With the DASH eating plan, you should limit salt (sodium) intake to 2,300 mg a day. If you have hypertension, you may need to reduce your sodium intake to 1,500 mg a day.  When on the DASH eating plan, aim to eat more fresh fruits and vegetables, whole grains, lean proteins, low-fat dairy, and heart-healthy fats.  Work with your health care provider or diet and nutrition specialist (dietitian) to adjust your eating plan to your   individual calorie needs. This information is not intended to replace advice given to you by your health care provider. Make sure you discuss any questions you have with your health care provider. Document Revised: 01/16/2017 Document Reviewed: 01/28/2016 Elsevier Patient Education  2020 Elsevier Inc.  

## 2019-10-27 ENCOUNTER — Other Ambulatory Visit: Payer: Self-pay

## 2019-10-27 ENCOUNTER — Ambulatory Visit: Payer: Self-pay | Admitting: Gerontology

## 2019-10-27 ENCOUNTER — Encounter: Payer: Self-pay | Admitting: Gerontology

## 2019-10-27 ENCOUNTER — Other Ambulatory Visit: Payer: Self-pay | Admitting: Gerontology

## 2019-10-27 VITALS — BP 129/82 | HR 84 | Temp 98.2°F | Resp 18 | Ht 64.0 in | Wt 246.8 lb

## 2019-10-27 DIAGNOSIS — I10 Essential (primary) hypertension: Secondary | ICD-10-CM

## 2019-10-27 DIAGNOSIS — Z0000 Encounter for general adult medical examination without abnormal findings: Secondary | ICD-10-CM

## 2019-10-27 MED ORDER — HYDROCHLOROTHIAZIDE 25 MG PO TABS: ORAL_TABLET | ORAL | 1 refills | Status: DC

## 2019-10-27 NOTE — Progress Notes (Signed)
Established Patient Office Visit  Subjective:  Patient ID: Crystal Hays, female    DOB: 04/27/1960  Age: 59 y.o. MRN: [id number]  CC:  Chief Complaint  Patient presents with  . Follow-up    HPI Crystal Hays presents for follow up for hypertension and medication refill. She states that she's compliant with her medication regimen and adheres to DASH diet and exercises as tolerated. She checks her blood pressure once a week and she reports that it's usually less than 140/90. She denies chest pain, palpitation, light headedness, fever and chills. Overall she states that she's doing well and offers no further complaint.  Past Medical History:  Diagnosis Date  . Allergy   . Asthma   . Hypertension     Past Surgical History:  Procedure Laterality Date  . CESAREAN SECTION    . COLONOSCOPY WITH PROPOFOL    . COLONOSCOPY WITH PROPOFOL N/A 05/23/2019   Procedure: COLONOSCOPY WITH PROPOFOL;  Surgeon: Jonathon Bellows, MD;  Location: [facility] ENDOSCOPY;  Service: Gastroenterology;  Laterality: N/A;  PRIORITY 4  . CYST EXCISION      Family History  Problem Relation Age of Onset  . Heart disease Father   . Prostate cancer Father   . Diabetes Maternal Aunt   . Breast cancer Neg Hx     Social History   Socioeconomic History  . Marital status: Legally Separated    Spouse name: Not on file  . Number of children: 5  . Years of education: Not on file  . Highest education level: High school graduate  Occupational History  . Not on file  Tobacco Use  . Smoking status: Former Smoker    Types: Cigarettes    Quit date: 2004    Years since quitting: 17.7  . Smokeless tobacco: Never Used  . Tobacco comment: quit  Vaping Use  . Vaping Use: Never used  Substance and Sexual Activity  . Alcohol use: Not Currently  . Drug use: No  . Sexual activity: Not Currently  Other Topics Concern  . Not on file  Social History Narrative   Lives with son and is well taken care of      Asked about a  braces program so I told her about dental and she had already been seen there.      --After I explained the program, patient said she did not need anything from it at this time and did not want to participate. No consent form signed.    Social Determinants of Health   Financial Resource Strain: Medium Risk  . Difficulty of Paying Living Expenses: Somewhat hard  Food Insecurity: No Food Insecurity  . Worried About Charity fundraiser in the Last Year: Never true  . Ran Out of Food in the Last Year: Never true  Transportation Needs: No Transportation Needs  . Lack of Transportation (Medical): No  . Lack of Transportation (Non-Medical): No  Physical Activity: Insufficiently Active  . Days of Exercise per Week: 7 days  . Minutes of Exercise per Session: 20 min  Stress:   . Feeling of Stress : Not on file  Social Connections: Moderately Integrated  . Frequency of Communication with Friends and Family: More than three times a week  . Frequency of Social Gatherings with Friends and Family: More than three times a week  . Attends Religious Services: 1 to 4 times per year  . Active Member of Clubs or Organizations: Yes  . Attends Archivist Meetings: 1 to  4 times per year  . Marital Status: Separated  Intimate Partner Violence: Not At Risk  . Fear of Current or Ex-Partner: No  . Emotionally Abused: No  . Physically Abused: No  . Sexually Abused: No    Outpatient Medications Prior to Visit  Medication Sig Dispense Refill  . aspirin EC 81 MG tablet Take 81 mg by mouth every other day.     Marland Kitchen BLACK ELDERBERRY PO Take by mouth.    . calcium-vitamin D (OSCAL WITH D) 500-200 MG-UNIT tablet Take 1 tablet by mouth.    . hydrochlorothiazide (HYDRODIURIL) 25 MG tablet TAKE ONE TABLET BY MOUTH EVERY DAY 30 tablet 3  . albuterol (VENTOLIN HFA) 108 (90 Base) MCG/ACT inhaler Inhale 1-2 puffs into the lungs every 6 (six) hours as needed for wheezing or shortness of breath. (Patient not  taking: Reported on 04/21/2019) 18 g 3  . triamcinolone (KENALOG) 0.025 % cream Apply 1 application topically as needed. (Patient not taking: Reported on 04/21/2019) 30 g 1   No facility-administered medications prior to visit.    No Known Allergies  ROS Review of Systems  Constitutional: Negative.   Eyes: Negative.   Respiratory: Negative.   Cardiovascular: Negative.   Neurological: Negative.   Psychiatric/Behavioral: Negative.       Objective:    Physical Exam Constitutional:      Appearance: Normal appearance.  HENT:     Head: Normocephalic and atraumatic.  Eyes:     Extraocular Movements: Extraocular movements intact.     Conjunctiva/sclera: Conjunctivae normal.     Pupils: Pupils are equal, round, and reactive to light.  Cardiovascular:     Rate and Rhythm: Normal rate and regular rhythm.     Pulses: Normal pulses.     Heart sounds: Normal heart sounds.  Pulmonary:     Effort: Pulmonary effort is normal.     Breath sounds: Normal breath sounds.  Neurological:     General: No focal deficit present.     Mental Status: She is alert and oriented to person, place, and time. Mental status is at baseline.  Psychiatric:        Mood and Affect: Mood normal.        Behavior: Behavior normal.        Thought Content: Thought content normal.        Judgment: Judgment normal.     BP 129/82 (BP Location: Left Arm, Patient Position: Sitting, Cuff Size: Large)   Pulse 84   Temp 98.2 F (36.8 C) (Temporal)   Resp 18   Ht _0  (1.626 m)   Wt 246 lb 12.8 oz (111.9 kg)   LMP 06/25/2014 (Approximate)   SpO2 95%   BMI 42.36 kg/m  Wt Readings from Last 3 Encounters:  10/27/19 246 lb 12.8 oz (111.9 kg)  07/27/19 242 lb (109.8 kg)  05/25/19 235 lb 12.8 oz (107 kg)   She gained 4 pounds in 3 months and was advised to continue on her weight loss regimen.  Health Maintenance Due  Topic Date Due  . Hepatitis C Screening  Never done  . HIV Screening  Never done  .  TETANUS/TDAP  Never done  . INFLUENZA VACCINE  Never done    There are no preventive care reminders to display for this patient.  Lab Results  Component Value Date   TSH 0.833 06/23/2017   Lab Results  Component Value Date   WBC 4.1 05/25/2019   HGB 14.3 05/25/2019   HCT  43.2 05/25/2019   MCV 87 05/25/2019   PLT 266 08/05/2018   Lab Results  Component Value Date   NA 143 05/25/2019   K 3.9 05/25/2019   CO2 28 05/25/2019   GLUCOSE 88 05/25/2019   BUN 17 05/25/2019   CREATININE 0.69 05/25/2019   BILITOT 0.4 05/25/2019   ALKPHOS 64 05/25/2019   AST 17 05/25/2019   ALT 13 05/25/2019   PROT 7.4 05/25/2019   ALBUMIN 4.5 05/25/2019   CALCIUM 9.6 05/25/2019   Lab Results  Component Value Date   CHOL 186 05/25/2019   Lab Results  Component Value Date   HDL 81 05/25/2019   Lab Results  Component Value Date   LDLCALC 94 05/25/2019   Lab Results  Component Value Date   TRIG 60 05/25/2019   Lab Results  Component Value Date   CHOLHDL 2.7 08/05/2018   Lab Results  Component Value Date   HGBA1C 5.4 05/25/2019      Assessment & Plan:    1. Essential hypertension - Her blood pressure is under control and she will continue on current treatment regimen, DASH diet and exercise as tolerated. - hydrochlorothiazide (HYDRODIURIL) 25 MG tablet; TAKE ONE TABLET BY MOUTH EVERY DAY  Dispense: 90 tablet; Refill: 1  2. Healthcare maintenance - Routine labs will be checked. - Lipid panel; Future - Comp Met (CMET); Future - HgB A1c; Future - CBC w/Diff; Future - Urinalysis; Future    Follow-up: Return in about 7 months (around 05/30/2020).    Steven Veazie Jerold Coombe, NP

## 2019-10-27 NOTE — Patient Instructions (Signed)
DASH Eating Plan DASH stands for "Dietary Approaches to Stop Hypertension." The DASH eating plan is a healthy eating plan that has been shown to reduce high blood pressure (hypertension). It may also reduce your risk for type 2 diabetes, heart disease, and stroke. The DASH eating plan may also help with weight loss. What are tips for following this plan?  General guidelines  Avoid eating more than 2,300 mg (milligrams) of salt (sodium) a day. If you have hypertension, you may need to reduce your sodium intake to 1,500 mg a day.  Limit alcohol intake to no more than 1 drink a day for nonpregnant women and 2 drinks a day for men. One drink equals 12 oz of beer, 5 oz of wine, or 1 oz of hard liquor.  Work with your health care provider to maintain a healthy body weight or to lose weight. Ask what an ideal weight is for you.  Get at least 30 minutes of exercise that causes your heart to beat faster (aerobic exercise) most days of the week. Activities may include walking, swimming, or biking.  Work with your health care provider or diet and nutrition specialist (dietitian) to adjust your eating plan to your individual calorie needs. Reading food labels   Check food labels for the amount of sodium per serving. Choose foods with less than 5 percent of the Daily Value of sodium. Generally, foods with less than 300 mg of sodium per serving fit into this eating plan.  To find whole grains, look for the word "whole" as the first word in the ingredient list. Shopping  Buy products labeled as "low-sodium" or "no salt added."  Buy fresh foods. Avoid canned foods and premade or frozen meals. Cooking  Avoid adding salt when cooking. Use salt-free seasonings or herbs instead of table salt or sea salt. Check with your health care provider or pharmacist before using salt substitutes.  Do not fry foods. Cook foods using healthy methods such as baking, boiling, grilling, and broiling instead.  Cook with  heart-healthy oils, such as olive, canola, soybean, or sunflower oil. Meal planning  Eat a balanced diet that includes: ? 5 or more servings of fruits and vegetables each day. At each meal, try to fill half of your plate with fruits and vegetables. ? Up to 6-8 servings of whole grains each day. ? Less than 6 oz of lean meat, poultry, or fish each day. A 3-oz serving of meat is about the same size as a deck of cards. One egg equals 1 oz. ? 2 servings of low-fat dairy each day. ? A serving of nuts, seeds, or beans 5 times each week. ? Heart-healthy fats. Healthy fats called Omega-3 fatty acids are found in foods such as flaxseeds and coldwater fish, like sardines, salmon, and mackerel.  Limit how much you eat of the following: ? Canned or prepackaged foods. ? Food that is high in trans fat, such as fried foods. ? Food that is high in saturated fat, such as fatty meat. ? Sweets, desserts, sugary drinks, and other foods with added sugar. ? Full-fat dairy products.  Do not salt foods before eating.  Try to eat at least 2 vegetarian meals each week.  Eat more home-cooked food and less restaurant, buffet, and fast food.  When eating at a restaurant, ask that your food be prepared with less salt or no salt, if possible. What foods are recommended? The items listed may not be a complete list. Talk with your dietitian about   what dietary choices are best for you. Grains Whole-grain or whole-wheat bread. Whole-grain or whole-wheat pasta. Brown rice. Oatmeal. Quinoa. Bulgur. Whole-grain and low-sodium cereals. Pita bread. Low-fat, low-sodium crackers. Whole-wheat flour tortillas. Vegetables Fresh or frozen vegetables (raw, steamed, roasted, or grilled). Low-sodium or reduced-sodium tomato and vegetable juice. Low-sodium or reduced-sodium tomato sauce and tomato paste. Low-sodium or reduced-sodium canned vegetables. Fruits All fresh, dried, or frozen fruit. Canned fruit in natural juice (without  added sugar). Meat and other protein foods Skinless chicken or turkey. Ground chicken or turkey. Pork with fat trimmed off. Fish and seafood. Egg whites. Dried beans, peas, or lentils. Unsalted nuts, nut butters, and seeds. Unsalted canned beans. Lean cuts of beef with fat trimmed off. Low-sodium, lean deli meat. Dairy Low-fat (1%) or fat-free (skim) milk. Fat-free, low-fat, or reduced-fat cheeses. Nonfat, low-sodium ricotta or cottage cheese. Low-fat or nonfat yogurt. Low-fat, low-sodium cheese. Fats and oils Soft margarine without trans fats. Vegetable oil. Low-fat, reduced-fat, or light mayonnaise and salad dressings (reduced-sodium). Canola, safflower, olive, soybean, and sunflower oils. Avocado. Seasoning and other foods Herbs. Spices. Seasoning mixes without salt. Unsalted popcorn and pretzels. Fat-free sweets. What foods are not recommended? The items listed may not be a complete list. Talk with your dietitian about what dietary choices are best for you. Grains Baked goods made with fat, such as croissants, muffins, or some breads. Dry pasta or rice meal packs. Vegetables Creamed or fried vegetables. Vegetables in a cheese sauce. Regular canned vegetables (not low-sodium or reduced-sodium). Regular canned tomato sauce and paste (not low-sodium or reduced-sodium). Regular tomato and vegetable juice (not low-sodium or reduced-sodium). Pickles. Olives. Fruits Canned fruit in a light or heavy syrup. Fried fruit. Fruit in cream or butter sauce. Meat and other protein foods Fatty cuts of meat. Ribs. Fried meat. Bacon. Sausage. Bologna and other processed lunch meats. Salami. Fatback. Hotdogs. Bratwurst. Salted nuts and seeds. Canned beans with added salt. Canned or smoked fish. Whole eggs or egg yolks. Chicken or turkey with skin. Dairy Whole or 2% milk, cream, and half-and-half. Whole or full-fat cream cheese. Whole-fat or sweetened yogurt. Full-fat cheese. Nondairy creamers. Whipped toppings.  Processed cheese and cheese spreads. Fats and oils Butter. Stick margarine. Lard. Shortening. Ghee. Bacon fat. Tropical oils, such as coconut, palm kernel, or palm oil. Seasoning and other foods Salted popcorn and pretzels. Onion salt, garlic salt, seasoned salt, table salt, and sea salt. Worcestershire sauce. Tartar sauce. Barbecue sauce. Teriyaki sauce. Soy sauce, including reduced-sodium. Steak sauce. Canned and packaged gravies. Fish sauce. Oyster sauce. Cocktail sauce. Horseradish that you find on the shelf. Ketchup. Mustard. Meat flavorings and tenderizers. Bouillon cubes. Hot sauce and Tabasco sauce. Premade or packaged marinades. Premade or packaged taco seasonings. Relishes. Regular salad dressings. Where to find more information:  National Heart, Lung, and Blood Institute: www.nhlbi.nih.gov  American Heart Association: www.heart.org Summary  The DASH eating plan is a healthy eating plan that has been shown to reduce high blood pressure (hypertension). It may also reduce your risk for type 2 diabetes, heart disease, and stroke.  With the DASH eating plan, you should limit salt (sodium) intake to 2,300 mg a day. If you have hypertension, you may need to reduce your sodium intake to 1,500 mg a day.  When on the DASH eating plan, aim to eat more fresh fruits and vegetables, whole grains, lean proteins, low-fat dairy, and heart-healthy fats.  Work with your health care provider or diet and nutrition specialist (dietitian) to adjust your eating plan to your   individual calorie needs. This information is not intended to replace advice given to you by your health care provider. Make sure you discuss any questions you have with your health care provider. Document Revised: 01/16/2017 Document Reviewed: 01/28/2016 Elsevier Patient Education  2020 Elsevier Inc.  

## 2019-11-23 ENCOUNTER — Ambulatory Visit
Admission: RE | Admit: 2019-11-23 | Discharge: 2019-11-23 | Disposition: A | Discharge status: Home / Self Care | Payer: Self-pay | Source: Ambulatory Visit | Attending: Oncology | Admitting: Oncology

## 2019-11-23 ENCOUNTER — Ambulatory Visit: Payer: Self-pay | Attending: Oncology

## 2019-11-23 ENCOUNTER — Other Ambulatory Visit: Payer: Self-pay

## 2019-11-23 VITALS — BP 157/85 | HR 78 | Temp 97.8°F | Ht 64.0 in | Wt 247.0 lb

## 2019-11-23 DIAGNOSIS — Z0000 Encounter for general adult medical examination without abnormal findings: Secondary | ICD-10-CM | POA: Insufficient documentation

## 2019-11-23 NOTE — Progress Notes (Signed)
  Subjective:     Patient ID: Crystal Hays, female   DOB: 25-Jan-1961, 59 y.o.   MRN: [id number]  HPI   Review of Systems     Objective:   Physical Exam Chest:     Breasts:        Right: No swelling, bleeding, inverted nipple, mass, nipple discharge, skin change or tenderness.        Left: No swelling, bleeding, inverted nipple, mass, nipple discharge, skin change or tenderness.     Comments: Tattoo upper left chest       Assessment:     59 year old patient returns for annual BCCCP screening.  Patient screened, and meets BCCCP eligibility.  Patient does not have insurance, Medicare or Medicaid. Instructed patient on breast self awareness using teach back method.  Clinical breast exam unremarkable.  Denies any breast problems . Risk Assessment    Risk Scores      11/23/2019 11/17/2018   Last edited by: Alta Corning, CMA Jim Like, RN   5-year risk: 1.4 % 1.4 %   Lifetime risk: 7.1 % 7.3 %            Plan:     Sent for bilateral screening mammogram.

## 2019-11-25 NOTE — Progress Notes (Signed)
Letter mailed from [facility] to notify of normal mammogram results.  Patient to return in one year for annual screening.  Copy to [facility]. 

## 2020-04-16 ENCOUNTER — Other Ambulatory Visit: Payer: Self-pay

## 2020-04-17 ENCOUNTER — Other Ambulatory Visit: Payer: Self-pay | Admitting: Gerontology

## 2020-04-17 ENCOUNTER — Other Ambulatory Visit: Payer: Self-pay

## 2020-04-17 ENCOUNTER — Ambulatory Visit: Payer: Self-pay

## 2020-04-17 VITALS — BP 122/87 | Wt 225.0 lb

## 2020-04-17 DIAGNOSIS — Z87898 Personal history of other specified conditions: Secondary | ICD-10-CM

## 2020-04-17 DIAGNOSIS — I10 Essential (primary) hypertension: Secondary | ICD-10-CM

## 2020-04-17 DIAGNOSIS — Z79899 Other long term (current) drug therapy: Secondary | ICD-10-CM

## 2020-04-17 DIAGNOSIS — L209 Atopic dermatitis, unspecified: Secondary | ICD-10-CM

## 2020-04-17 MED ORDER — HYDROCHLOROTHIAZIDE 25 MG PO TABS: ORAL_TABLET | ORAL | 1 refills | Status: DC

## 2020-04-17 MED ORDER — TRIAMCINOLONE ACETONIDE 0.1 % EX CREA: 1.0000 "application " | TOPICAL_CREAM | Freq: Two times a day (BID) | CUTANEOUS | 0 refills | Status: DC

## 2020-04-17 MED ORDER — ALBUTEROL SULFATE HFA 108 (90 BASE) MCG/ACT IN AERS: 1.0000 | INHALATION_SPRAY | Freq: Four times a day (QID) | RESPIRATORY_TRACT | 3 refills | Status: DC | PRN

## 2020-04-17 NOTE — Progress Notes (Signed)
  Medication Management Clinic Visit Note  Patient: Crystal Hays MRN: [id number] Date of Birth: Apr 16, 1960 PCP: Rolm Gala, NP   [name] 60 y.o. female presents for a MTM visit today.  LMP 06/25/2014 (Approximate)   Patient Information   Past Medical History:  Diagnosis Date  . Allergy   . Asthma   . Hypertension       Past Surgical History:  Procedure Laterality Date  . CESAREAN SECTION    . COLONOSCOPY WITH PROPOFOL    . COLONOSCOPY WITH PROPOFOL N/A 05/23/2019   Procedure: COLONOSCOPY WITH PROPOFOL;  Surgeon: Wyline Mood, MD;  Location: [facility] ENDOSCOPY;  Service: Gastroenterology;  Laterality: N/A;  PRIORITY 4  . CYST EXCISION       Family History  Problem Relation Age of Onset  . Heart disease Father   . Prostate cancer Father   . Diabetes Maternal Aunt   . Breast cancer Neg Hx     New Diagnoses (since last visit):   Family Support: Good  Lifestyle Diet: Breakfast: Smoothie with plant-based protein and vegetable or fruits (berries, pineapple), flax seed, chia seed, spinach Lunch: cauliflower crust pizza with peppers and onions or vegan sausage with scrambled egg and olive oil and toast Snack: unsalted popcorn or peanut butter crackers Dinner: salad or [employer] chicken  Drinks: water      Social History   Substance and Sexual Activity  Alcohol Use Not Currently      Social History   Tobacco Use  Smoking Status Former Smoker  . Types: Cigarettes  . Quit date: 2004  . Years since quitting: 18.1  Smokeless Tobacco Never Used  Tobacco Comment   quit      Health Maintenance  Topic Date Due  . Hepatitis C Screening  Never done  . HIV Screening  Never done  . TETANUS/TDAP  Never done  . INFLUENZA VACCINE  Never done  . COVID-19 Vaccine (3 - Booster for Pfizer series) 01/02/2020  . PAP SMEAR-Modifier  11/16/2021  . MAMMOGRAM  11/22/2021  . COLONOSCOPY (Pts 45-38yrs Insurance coverage will need to be confirmed)  05/22/2029  .  HPV VACCINES  Aged Out   Health Maintenance/Date Completed  Last ED visit: 02/22/17 Last Visit to PCP: 10/27/19 Next Visit to PCP: April 2022 Dental Exam: 2019-2020 Eye Exam: 2019 Pelvic/PAP Exam: Oct 2021 Mammogram: Oct 2021 Colonoscopy: April 2021 Flu Vaccine: Pt refused Pneumonia Vaccine: No COVID-19 Vaccine: 2 doses + booster Shingrix Vaccine: No  Assessment and Plan:  Access and adherence: Pt reports no issues with access to medications, food, transportation or housing or medication adherence.   Hypertension: Pt takes hydrocholorthiazide 25 mg daily. Pt monitors blood pressure at home with most recent reading 122/87.  Plan: Continue hydrochlorothiazide 25 mg daily and monitoring blood pressure at home. Follow-up with PCP in April. Refill has been entered for HCTZ.  Asthma: Pt uses albuterol inhaler as-needed. Pt has been working on weight loss (-25 lbs) which has lowered amount of rescue inhaler use. Plan: Continue with weight loss and albuterol as-needed.   General Health: aspirin 81 mg daily, vitamin C, vitamin D, and elderberry supplement daily. Pt asked about adding calcium supplement daily. Informed pt that calcium may help with bone strength with age.  Plan: Continue with daily supplements and vitamins. Consider addition of calcium supplement daily.   Follow-up for Outreach MTM in 1 year  Reatha Armour, PharmD Pharmacy Resident  04/17/2020 2:27 PM

## 2020-05-21 ENCOUNTER — Other Ambulatory Visit: Payer: Self-pay

## 2020-05-23 ENCOUNTER — Other Ambulatory Visit: Payer: Self-pay

## 2020-05-23 DIAGNOSIS — Z0000 Encounter for general adult medical examination without abnormal findings: Secondary | ICD-10-CM

## 2020-05-24 LAB — COMPREHENSIVE METABOLIC PANEL
ALT: 27 IU/L (ref 0–32)
AST: 20 IU/L (ref 0–40)
Albumin/Globulin Ratio: 1.4 (ref 1.2–2.2)
Albumin: 4.5 g/dL (ref 3.8–4.9)
Alkaline Phosphatase: 72 IU/L (ref 44–121)
BUN/Creatinine Ratio: 19 (ref 12–28)
BUN: 12 mg/dL (ref 8–27)
Bilirubin Total: 0.2 mg/dL (ref 0.0–1.2)
CO2: 26 mmol/L (ref 20–29)
Calcium: 9.2 mg/dL (ref 8.7–10.3)
Chloride: 95 mmol/L — ABNORMAL LOW (ref 96–106)
Creatinine, Ser: 0.62 mg/dL (ref 0.57–1.00)
Globulin, Total: 3.2 g/dL (ref 1.5–4.5)
Glucose: 96 mg/dL (ref 65–99)
Potassium: 3.8 mmol/L (ref 3.5–5.2)
Sodium: 137 mmol/L (ref 134–144)
Total Protein: 7.7 g/dL (ref 6.0–8.5)
eGFR: 102 mL/min/{1.73_m2} (ref >59)

## 2020-05-24 LAB — CBC WITH DIFFERENTIAL/PLATELET
Basophils Absolute: 0 10*3/uL (ref 0.0–0.2)
Basos: 1 %
EOS (ABSOLUTE): 0.4 10*3/uL (ref 0.0–0.4)
Eos: 6 %
Hematocrit: 43 % (ref 34.0–46.6)
Hemoglobin: 14.1 g/dL (ref 11.1–15.9)
Immature Grans (Abs): 0 10*3/uL (ref 0.0–0.1)
Immature Granulocytes: 0 %
Lymphocytes Absolute: 2.6 10*3/uL (ref 0.7–3.1)
Lymphs: 41 %
MCH: 27.9 pg (ref 26.6–33.0)
MCHC: 32.8 g/dL (ref 31.5–35.7)
MCV: 85 fL (ref 79–97)
Monocytes Absolute: 0.5 10*3/uL (ref 0.1–0.9)
Monocytes: 7 %
Neutrophils Absolute: 2.8 10*3/uL (ref 1.4–7.0)
Neutrophils: 45 %
Platelets: 295 10*3/uL (ref 150–450)
RBC: 5.06 x10E6/uL (ref 3.77–5.28)
RDW: 13.3 % (ref 11.7–15.4)
WBC: 6.3 10*3/uL (ref 3.4–10.8)

## 2020-05-24 LAB — URINALYSIS
Bilirubin, UA: NEGATIVE
Glucose, UA: NEGATIVE
Ketones, UA: NEGATIVE
Nitrite, UA: NEGATIVE
Protein,UA: NEGATIVE
RBC, UA: NEGATIVE
Specific Gravity, UA: 1.008 (ref 1.005–1.030)
Urobilinogen, Ur: 0.2 mg/dL (ref 0.2–1.0)
pH, UA: 5.5 (ref 5.0–7.5)

## 2020-05-24 LAB — LIPID PANEL
Chol/HDL Ratio: 2.4 ratio (ref 0.0–4.4)
Cholesterol, Total: 208 mg/dL — ABNORMAL HIGH (ref 100–199)
HDL: 85 mg/dL (ref >39)
LDL Chol Calc (NIH): 112 mg/dL — ABNORMAL HIGH (ref 0–99)
Triglycerides: 63 mg/dL (ref 0–149)
VLDL Cholesterol Cal: 11 mg/dL (ref 5–40)

## 2020-05-24 LAB — HEMOGLOBIN A1C
Est. average glucose Bld gHb Est-mCnc: 117 mg/dL
Hgb A1c MFr Bld: 5.7 % — ABNORMAL HIGH (ref 4.8–5.6)

## 2020-05-31 ENCOUNTER — Other Ambulatory Visit: Payer: Self-pay

## 2020-05-31 ENCOUNTER — Ambulatory Visit: Payer: Self-pay | Admitting: Gerontology

## 2020-05-31 ENCOUNTER — Encounter: Payer: Self-pay | Admitting: Gerontology

## 2020-05-31 VITALS — BP 144/88 | HR 80 | Ht 64.0 in | Wt 247.6 lb

## 2020-05-31 DIAGNOSIS — I10 Essential (primary) hypertension: Secondary | ICD-10-CM

## 2020-05-31 DIAGNOSIS — R7303 Prediabetes: Secondary | ICD-10-CM

## 2020-05-31 DIAGNOSIS — E785 Hyperlipidemia, unspecified: Secondary | ICD-10-CM | POA: Insufficient documentation

## 2020-05-31 NOTE — Progress Notes (Signed)
Established Patient Office Visit  Subjective:  Patient ID: Crystal Hays, female    DOB: [birth year]  Age: 61 y.o. MRN: [id number]  CC:  Chief Complaint  Patient presents with  . Follow-up    Follow up    HPI [name] 60 y/o female who has a history of Allergy, Asthma and Hypertension and presents for routine follow up, lab review and medication refill. She states that she's compliant with her medication regimen and adheres to DASH diet. She checks her blood pressure once a week and she reports that it's usually less than 140/90. Her breathing is stable. Her Total cholesterol done on 05/23/20 was  208 mg/dl and LDL 749 mg/dl, she states that she celebrated her 35 th birthday for one month and didn't adhere to her diet like she should. Her HgbA1c was 5.7%. She denies chest pain, palpitation, light headedness, fever and chills. Overall, she states that she's doing well and offers no further complaint.  Past Medical History:  Diagnosis Date  . Allergy   . Asthma   . Hypertension     Past Surgical History:  Procedure Laterality Date  . CESAREAN SECTION    . COLONOSCOPY WITH PROPOFOL    . COLONOSCOPY WITH PROPOFOL N/A 05/23/2019   Procedure: COLONOSCOPY WITH PROPOFOL;  Surgeon: Wyline Mood, MD;  Location: [facility] ENDOSCOPY;  Service: Gastroenterology;  Laterality: N/A;  PRIORITY 4  . CYST EXCISION      Family History  Problem Relation Age of Onset  . Heart disease Father   . Prostate cancer Father   . Diabetes Maternal Aunt   . Breast cancer Neg Hx     Social History   Socioeconomic History  . Marital status: Legally Separated    Spouse name: Not on file  . Number of children: 5  . Years of education: Not on file  . Highest education level: High school graduate  Occupational History  . Not on file  Tobacco Use  . Smoking status: Former Smoker    Types: Cigarettes    Quit date: 2004    Years since quitting: 18.2  . Smokeless tobacco: Never Used  . Tobacco comment:  quit  Vaping Use  . Vaping Use: Never used  Substance and Sexual Activity  . Alcohol use: Not Currently  . Drug use: No  . Sexual activity: Not Currently  Other Topics Concern  . Not on file  Social History Narrative   Lives with son and is well taken care of      Asked about a braces program so I told her about dental and she had already been seen there.      --After I explained the program, patient said she did not need anything from it at this time and did not want to participate. No consent form signed.    Social Determinants of Health   Financial Resource Strain: Medium Risk  . Difficulty of Paying Living Expenses: Somewhat hard  Food Insecurity: No Food Insecurity  . Worried About Programme researcher, broadcasting/film/video in the Last Year: Never true  . Ran Out of Food in the Last Year: Never true  Transportation Needs: No Transportation Needs  . Lack of Transportation (Medical): No  . Lack of Transportation (Non-Medical): No  Physical Activity: Insufficiently Active  . Days of Exercise per Week: 7 days  . Minutes of Exercise per Session: 20 min  Stress: Not on file  Social Connections: Moderately Integrated  . Frequency of Communication with Friends  and Family: More than three times a week  . Frequency of Social Gatherings with Friends and Family: More than three times a week  . Attends Religious Services: 1 to 4 times per year  . Active Member of Clubs or Organizations: Yes  . Attends Banker Meetings: 1 to 4 times per year  . Marital Status: Separated  Intimate Partner Violence: Not At Risk  . Fear of Current or Ex-Partner: No  . Emotionally Abused: No  . Physically Abused: No  . Sexually Abused: No    Outpatient Medications Prior to Visit  Medication Sig Dispense Refill  . albuterol (VENTOLIN HFA) 108 (90 Base) MCG/ACT inhaler INHALE 1 OR 2 PUFFS INTO THE LUNGS EVERY 6 HOURS AS NEEDED FOR WHEEZING OR SHORT OF BREATH 6.7 g 3  . Ascorbic Acid (VITAMIN C) 100 MG tablet  Take 100 mg by mouth daily.    Marland Kitchen aspirin EC 81 MG tablet Take 81 mg by mouth every other day.     Marland Kitchen BLACK ELDERBERRY PO Take by mouth.    . calcium-vitamin D (OSCAL WITH D) 500-200 MG-UNIT tablet Take 1 tablet by mouth.    . hydrochlorothiazide (HYDRODIURIL) 25 MG tablet TAKE ONE TABLET BY MOUTH EVERY DAY 90 tablet 1  . triamcinolone (KENALOG) 0.025 % cream Apply 1 application topically as needed. 30 g 1  . triamcinolone (KENALOG) 0.1 % APPLY 1 APPLICATION TOPICALLY 2 TIMES A DAY 30 g 0   No facility-administered medications prior to visit.    No Known Allergies  ROS Review of Systems  Constitutional: Negative.   HENT: Negative.   Eyes: Negative.   Respiratory: Negative.   Cardiovascular: Negative.   Genitourinary: Negative.   Skin: Negative.   Neurological: Negative.   Psychiatric/Behavioral: Negative.       Objective:    Physical Exam HENT:     Head: Normocephalic and atraumatic.  Eyes:     Extraocular Movements: Extraocular movements intact.     Conjunctiva/sclera: Conjunctivae normal.     Pupils: Pupils are equal, round, and reactive to light.  Cardiovascular:     Rate and Rhythm: Normal rate and regular rhythm.     Pulses: Normal pulses.     Heart sounds: Normal heart sounds.  Pulmonary:     Effort: Pulmonary effort is normal.     Breath sounds: Normal breath sounds.  Skin:    General: Skin is warm.  Neurological:     General: No focal deficit present.     Mental Status: She is alert and oriented to person, place, and time. Mental status is at baseline.  Psychiatric:        Mood and Affect: Mood normal.        Behavior: Behavior normal.        Thought Content: Thought content normal.     BP (!) 144/88 (BP Location: Left Arm, Patient Position: Sitting)   Pulse 80   Ht 5\' 4"  (1.626 m)   Wt 247 lb 9.6 oz (112.3 kg)   LMP 06/25/2014 (Approximate)   SpO2 93%   BMI 42.50 kg/m  Wt Readings from Last 3 Encounters:  05/31/20 247 lb 9.6 oz (112.3 kg)   05/23/20 249 lb 6.4 oz (113.1 kg)  04/17/20 225 lb (102.1 kg)   She gained 22 pounds in 6 weeks, weight loss encouraged.  Health Maintenance Due  Topic Date Due  . Hepatitis C Screening  Never done  . HIV Screening  Never done  . TETANUS/TDAP  Never done  . COVID-19 Vaccine (3 - Booster for Pfizer series) 01/02/2020    There are no preventive care reminders to display for this patient.  Lab Results  Component Value Date   TSH 0.833 06/23/2017   Lab Results  Component Value Date   WBC 6.3 05/23/2020   HGB 14.1 05/23/2020   HCT 43.0 05/23/2020   MCV 85 05/23/2020   PLT 295 05/23/2020   Lab Results  Component Value Date   NA 137 05/23/2020   K 3.8 05/23/2020   CO2 26 05/23/2020   GLUCOSE 96 05/23/2020   BUN 12 05/23/2020   CREATININE 0.62 05/23/2020   BILITOT 0.2 05/23/2020   ALKPHOS 72 05/23/2020   AST 20 05/23/2020   ALT 27 05/23/2020   PROT 7.7 05/23/2020   ALBUMIN 4.5 05/23/2020   CALCIUM 9.2 05/23/2020   Lab Results  Component Value Date   CHOL 208 (H) 05/23/2020   Lab Results  Component Value Date   HDL 85 05/23/2020   Lab Results  Component Value Date   LDLCALC 112 (H) 05/23/2020   Lab Results  Component Value Date   TRIG 63 05/23/2020   Lab Results  Component Value Date   CHOLHDL 2.4 05/23/2020   Lab Results  Component Value Date   HGBA1C 5.7 (H) 05/23/2020      Assessment & Plan:     1. Primary hypertension - Her blood pressure was elevated, her goal should be less than 150/90. She will continue on her medication, encouraged to continue on DASH diet and exercise as tolerated.  2. Prediabetes - Her HgbA1c was 5.7%, she deferred Metformin therapy, states that she will work on her diet and loose weight.  3. Morbid obesity (HCC) - She was encouraged to loose weight, continue on low calorie diet and exercise as tolerated.  4. Elevated lipids - Her ASCVD was 4.3%, she was encouraged to continue on low fat/cholesterol diet and  exercise as tolerated and loose weight.    Follow-up: Return in about 26 weeks (around 11/29/2020), or if symptoms worsen or fail to improve.    Aayansh Codispoti Trellis Paganini, NP

## 2020-05-31 NOTE — Patient Instructions (Signed)

## 2020-08-01 ENCOUNTER — Telehealth: Payer: Self-pay | Admitting: Emergency Medicine

## 2020-08-01 NOTE — Telephone Encounter (Signed)
Patient called in requesting Zpak or something for cough. Patient states she testing positive for covid on 07/30/20. Advised patient to use OTC cough medicines, increase fluids and symptom care. Antibiotics aren't indicated since covid is a viral disease. Gave patient ED precautions such as worsening symptoms and sob. Patient agreed and voiced understanding.

## 2020-09-07 ENCOUNTER — Other Ambulatory Visit: Payer: Self-pay

## 2020-10-04 ENCOUNTER — Other Ambulatory Visit: Payer: Self-pay

## 2020-10-04 MED FILL — Albuterol Sulfate Inhal Aero 108 MCG/ACT (90MCG Base Equiv): RESPIRATORY_TRACT | 25 days supply | Qty: 6.7 | Fill #0 | Status: CN

## 2020-10-04 MED FILL — Albuterol Sulfate Inhal Aero 108 MCG/ACT (90MCG Base Equiv): RESPIRATORY_TRACT | 25 days supply | Qty: 6.7 | Fill #0 | Status: AC

## 2020-10-04 MED FILL — Hydrochlorothiazide Tab 25 MG: ORAL | 30 days supply | Qty: 30 | Fill #0 | Status: AC

## 2020-11-22 ENCOUNTER — Telehealth: Payer: Self-pay | Admitting: Pharmacy Technician

## 2020-11-22 NOTE — Telephone Encounter (Signed)
Patient still needs to provide paystubs, bank statement and bill.  Patient aware that no additional medication assistance will be provided until requesting financial documentation is provided.  [name] Care Manager [facility]

## 2020-11-29 ENCOUNTER — Other Ambulatory Visit: Payer: Self-pay

## 2020-11-29 ENCOUNTER — Ambulatory Visit: Payer: Self-pay | Admitting: Gerontology

## 2020-11-29 VITALS — BP 163/92 | HR 81 | Temp 97.7°F | Wt 244.0 lb

## 2020-11-29 DIAGNOSIS — Z87898 Personal history of other specified conditions: Secondary | ICD-10-CM

## 2020-11-29 DIAGNOSIS — I10 Essential (primary) hypertension: Secondary | ICD-10-CM

## 2020-11-29 DIAGNOSIS — L209 Atopic dermatitis, unspecified: Secondary | ICD-10-CM

## 2020-11-29 MED ORDER — ALBUTEROL SULFATE HFA 108 (90 BASE) MCG/ACT IN AERS: INHALATION_SPRAY | RESPIRATORY_TRACT | 3 refills | Status: DC

## 2020-11-29 MED ORDER — HYDROCHLOROTHIAZIDE 25 MG PO TABS: ORAL_TABLET | Freq: Every day | ORAL | 2 refills | Status: DC

## 2020-11-29 MED ORDER — TRIAMCINOLONE ACETONIDE 0.025 % EX CREA: 1.0000 "application " | TOPICAL_CREAM | CUTANEOUS | 1 refills | Status: DC | PRN

## 2020-11-29 MED ORDER — ASPIRIN EC 81 MG PO TBEC: 81.0000 mg | DELAYED_RELEASE_TABLET | ORAL | 6 refills | Status: DC

## 2020-11-29 NOTE — Progress Notes (Signed)
Established Patient Office Visit  Subjective:  Patient ID: Crystal Hays, female    DOB: 06-Mar-1960  Age: 60 y.o. MRN: [id number]  CC: No chief complaint on file.   HPI Crystal Hays  is 60 y/o female who has a history of Allergy, Asthma and Hypertension and presents for routine follow up visit and medication refill. She states that she's complaint with her medications, denies side effects and continues to make healthy life style changes. She states that she will be relocating to [facility] in January and has health care insurance through her job and won't be coming to [facility]. She denies chest pain, palpitation, dizziness, headache and vision changes. Overall, she states that she's doing well and offers no further complaint.  Past Medical History:  Diagnosis Date   Allergy    Asthma    Hypertension     Past Surgical History:  Procedure Laterality Date   CESAREAN SECTION     COLONOSCOPY WITH PROPOFOL     COLONOSCOPY WITH PROPOFOL N/A 05/23/2019   Procedure: COLONOSCOPY WITH PROPOFOL;  Surgeon: Jonathon Bellows, MD;  Location: [facility] ENDOSCOPY;  Service: Gastroenterology;  Laterality: N/A;  PRIORITY 4   CYST EXCISION      Family History  Problem Relation Age of Onset   Heart disease Father    Prostate cancer Father    Diabetes Maternal Aunt    Breast cancer Neg Hx     Social History   Socioeconomic History   Marital status: Legally Separated    Spouse name: Not on file   Number of children: 5   Years of education: Not on file   Highest education level: High school graduate  Occupational History   Not on file  Tobacco Use   Smoking status: Former    Types: Cigarettes    Quit date: 2004    Years since quitting: 18.7   Smokeless tobacco: Never   Tobacco comments:    quit  Vaping Use   Vaping Use: Never used  Substance and Sexual Activity   Alcohol use: Not Currently   Drug use: No   Sexual activity: Not Currently  Other Topics Concern   Not on file  Social History  Narrative   Lives with son and is well taken care of      Asked about a braces program so I told her about dental and she had already been seen there.      --After I explained the program, patient said she did not need anything from it at this time and did not want to participate. No consent form signed.    Social Determinants of Health   Financial Resource Strain: Not on file  Food Insecurity: Not on file  Transportation Needs: Not on file  Physical Activity: Not on file  Stress: Not on file  Social Connections: Not on file  Intimate Partner Violence: Not on file    Outpatient Medications Prior to Visit  Medication Sig Dispense Refill   Ascorbic Acid (VITAMIN C) 100 MG tablet Take 100 mg by mouth daily.     BLACK ELDERBERRY PO Take by mouth.     calcium-vitamin D (OSCAL WITH D) 500-200 MG-UNIT tablet Take 1 tablet by mouth.     albuterol (VENTOLIN HFA) 108 (90 Base) MCG/ACT inhaler INHALE 1 OR 2 PUFFS INTO THE LUNGS EVERY 6 HOURS AS NEEDED FOR WHEEZING OR SHORT OF BREATH 6.7 g 3   aspirin EC 81 MG tablet Take 81 mg by mouth every other  day.      hydrochlorothiazide (HYDRODIURIL) 25 MG tablet TAKE ONE TABLET BY MOUTH EVERY DAY 90 tablet 1   triamcinolone (KENALOG) 0.025 % cream Apply 1 application topically as needed. 30 g 1   No facility-administered medications prior to visit.    No Known Allergies  ROS Review of Systems  Constitutional: Negative.   Eyes: Negative.   Respiratory: Negative.    Cardiovascular: Negative.   Endocrine: Negative.   Neurological: Negative.   Psychiatric/Behavioral: Negative.       Objective:    Physical Exam HENT:     Head: Normocephalic and atraumatic.     Mouth/Throat:     Mouth: Mucous membranes are moist.  Eyes:     Extraocular Movements: Extraocular movements intact.     Conjunctiva/sclera: Conjunctivae normal.     Pupils: Pupils are equal, round, and reactive to light.  Cardiovascular:     Rate and Rhythm: Normal rate and  regular rhythm.     Pulses: Normal pulses.     Heart sounds: Normal heart sounds.  Pulmonary:     Effort: Pulmonary effort is normal.     Breath sounds: Normal breath sounds.  Skin:    General: Skin is warm.  Neurological:     General: No focal deficit present.     Mental Status: She is alert and oriented to person, place, and time. Mental status is at baseline.    BP (!) 163/92 (BP Location: Left Arm, Patient Position: Sitting, Cuff Size: Large)   Pulse 81   Temp 97.7 F (36.5 C)   Wt 244 lb (110.7 kg)   LMP 06/25/2014 (Approximate)   BMI 41.88 kg/m  Wt Readings from Last 3 Encounters:  11/29/20 244 lb (110.7 kg)  05/31/20 247 lb 9.6 oz (112.3 kg)  05/23/20 249 lb 6.4 oz (113.1 kg)   Encouraged weight loss  Health Maintenance Due  Topic Date Due   HIV Screening  Never done   Hepatitis C Screening  Never done   TETANUS/TDAP  Never done   Zoster Vaccines- Shingrix (1 of 2) Never done   COVID-19 Vaccine (3 - Pfizer risk series) 07/30/2019   INFLUENZA VACCINE  Never done    There are no preventive care reminders to display for this patient.  Lab Results  Component Value Date   TSH 0.833 06/23/2017   Lab Results  Component Value Date   WBC 6.3 05/23/2020   HGB 14.1 05/23/2020   HCT 43.0 05/23/2020   MCV 85 05/23/2020   PLT 295 05/23/2020   Lab Results  Component Value Date   NA 137 05/23/2020   K 3.8 05/23/2020   CO2 26 05/23/2020   GLUCOSE 96 05/23/2020   BUN 12 05/23/2020   CREATININE 0.62 05/23/2020   BILITOT 0.2 05/23/2020   ALKPHOS 72 05/23/2020   AST 20 05/23/2020   ALT 27 05/23/2020   PROT 7.7 05/23/2020   ALBUMIN 4.5 05/23/2020   CALCIUM 9.2 05/23/2020   EGFR 102 05/23/2020   Lab Results  Component Value Date   CHOL 208 (H) 05/23/2020   Lab Results  Component Value Date   HDL 85 05/23/2020   Lab Results  Component Value Date   LDLCALC 112 (H) 05/23/2020   Lab Results  Component Value Date   TRIG 63 05/23/2020   Lab Results   Component Value Date   CHOLHDL 2.4 05/23/2020   Lab Results  Component Value Date   HGBA1C 5.7 (H) 05/23/2020  Assessment & Plan:   1. History of shortness of breath - Her breathing is stable, she will continue on current medication. - albuterol (VENTOLIN HFA) 108 (90 Base) MCG/ACT inhaler; INHALE 1 OR 2 PUFFS INTO THE LUNGS EVERY 6 HOURS AS NEEDED FOR WHEEZING OR SHORT OF BREATH  Dispense: 6.7 g; Refill: 3  2. Atopic dermatitis, unspecified type - She will continue as needed. - triamcinolone (KENALOG) 0.025 % cream; Apply 1 application topically as needed.  Dispense: 30 g; Refill: 1  3. Essential hypertension - Her blood pressure is not under control, she was advised to check and notify clinic if greater than 160 consistently. She was provided with [facility] to call and schedule new patient appointment. She will continue on DASH diet and exercise as tolerated. She was educated with the signs and symptoms of stroke and advised to go to the ED. - aspirin EC 81 MG tablet; Take 1 tablet (81 mg total) by mouth every other day.  Dispense: 30 tablet; Refill: 6 - hydrochlorothiazide (HYDRODIURIL) 25 MG tablet; TAKE ONE TABLET BY MOUTH EVERY DAY  Dispense: 90 tablet; Refill: 2      Follow-up: She has active health Insurance coverage and will follow up at [facility] until she relocates to [place]. [place] wishes her well in her care.    Deara Bober Jerold Coombe, NP

## 2020-11-29 NOTE — Patient Instructions (Signed)

## 2021-01-29 IMAGING — MG DIGITAL SCREENING BILAT W/ TOMO W/ CAD
6 of 10 series · 6 of 30 positions shown · non-contrast
Comparison: Previous exam(s).

CLINICAL DATA: Screening.

EXAM:
DIGITAL SCREENING BILATERAL MAMMOGRAM WITH TOMO AND CAD

[R CC synth-2D]
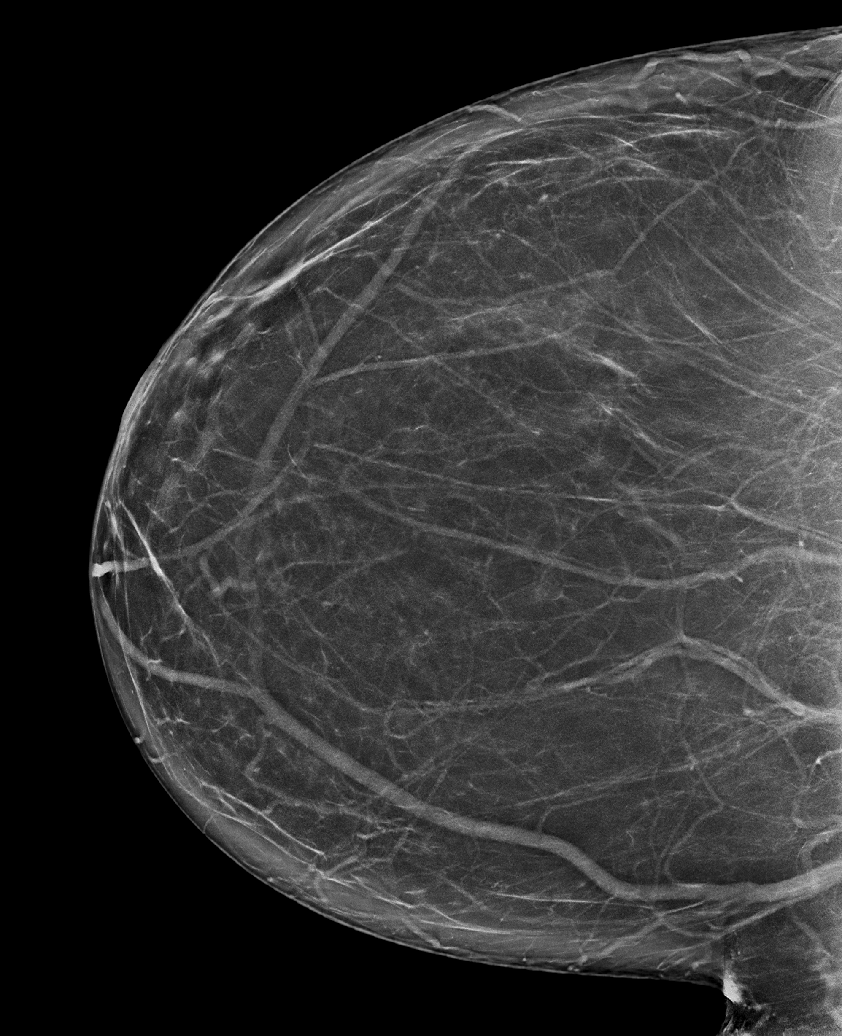

[L CC synth-2D]
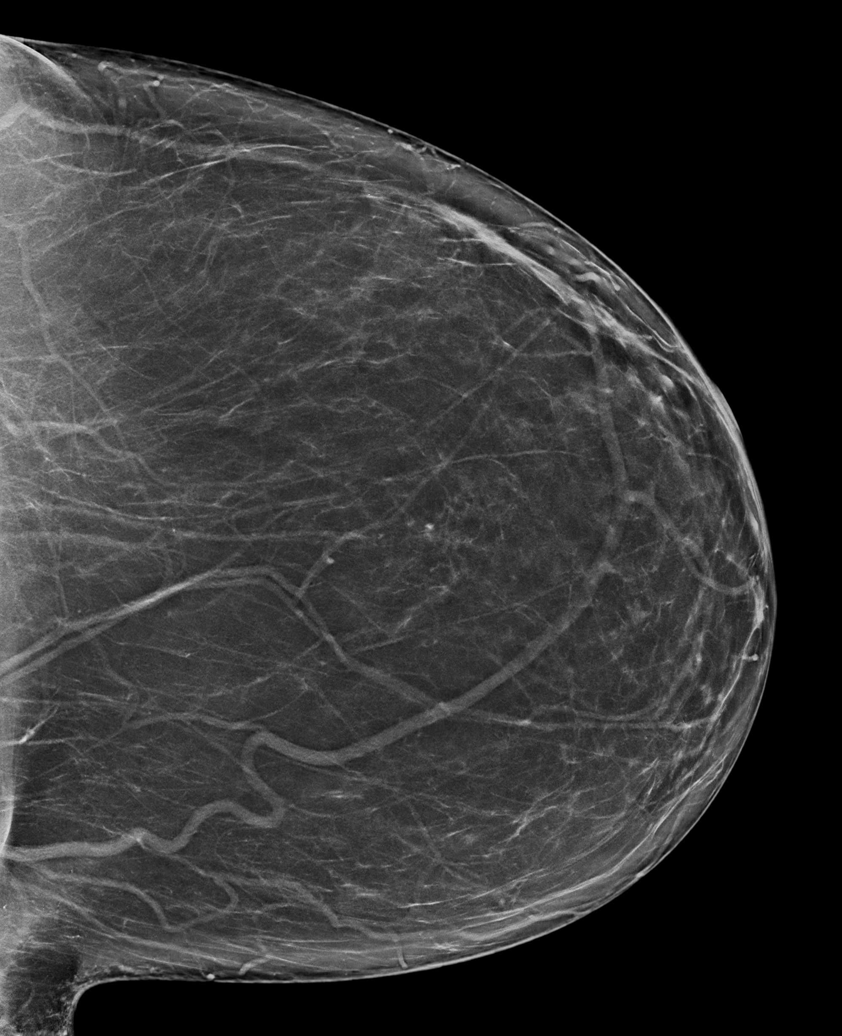

[R MLO synth-2D (1 of 2)]
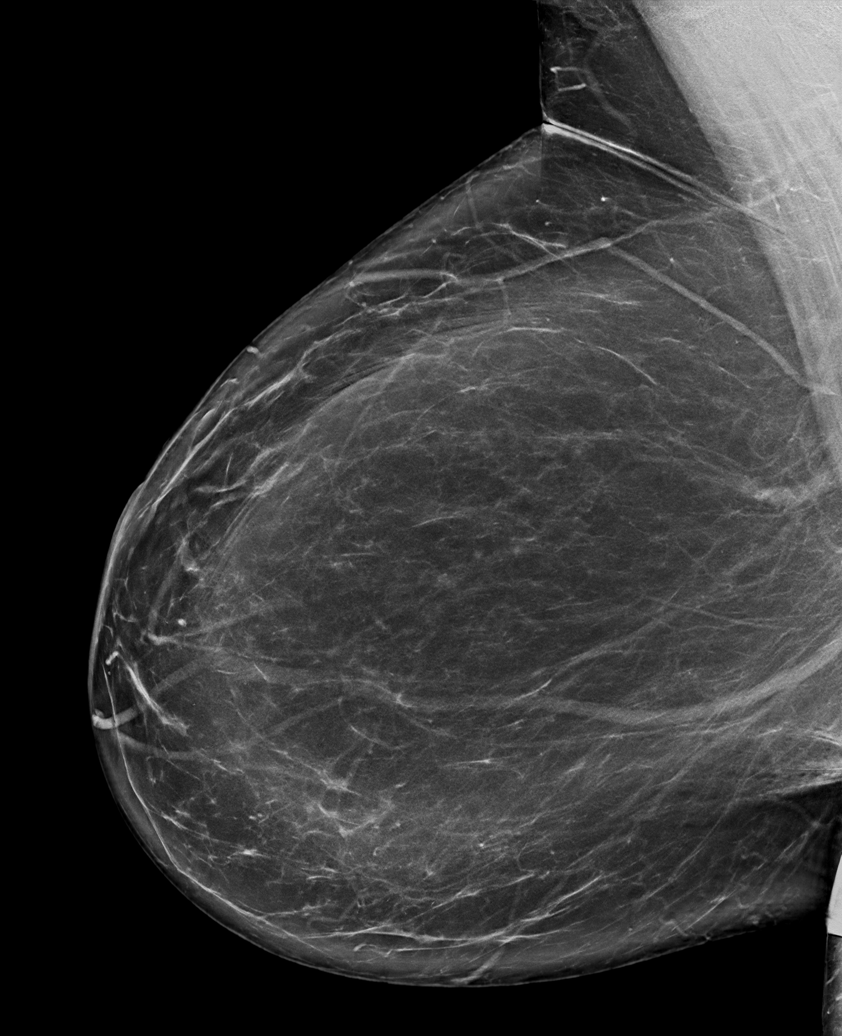

[L MLO synth-2D]
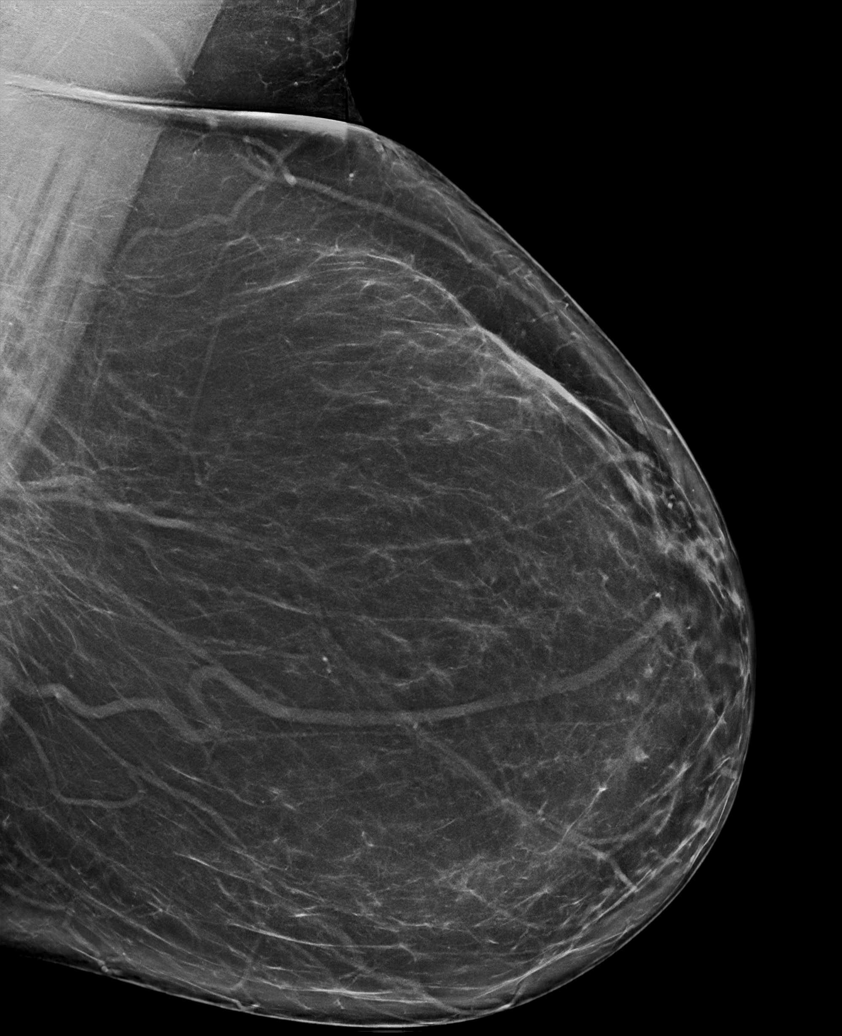

[R MLO synth-2D (2 of 2)]
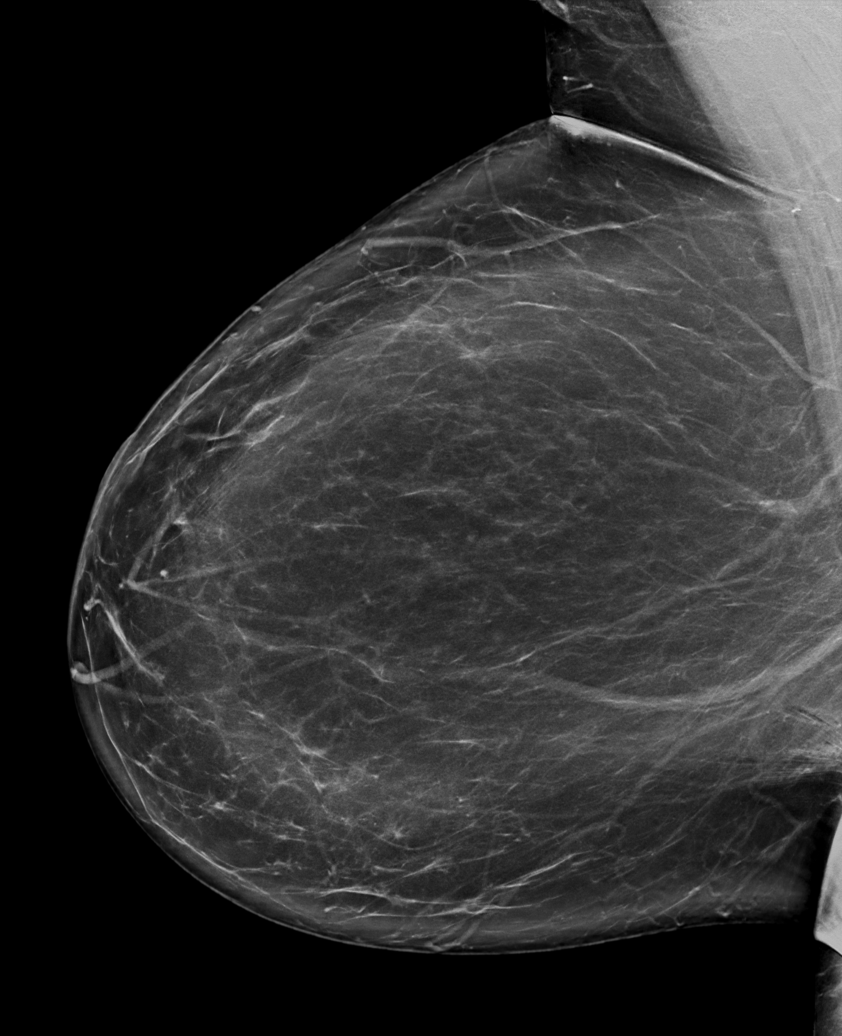

[L MLO tomo · tomo slice 47/92.0]
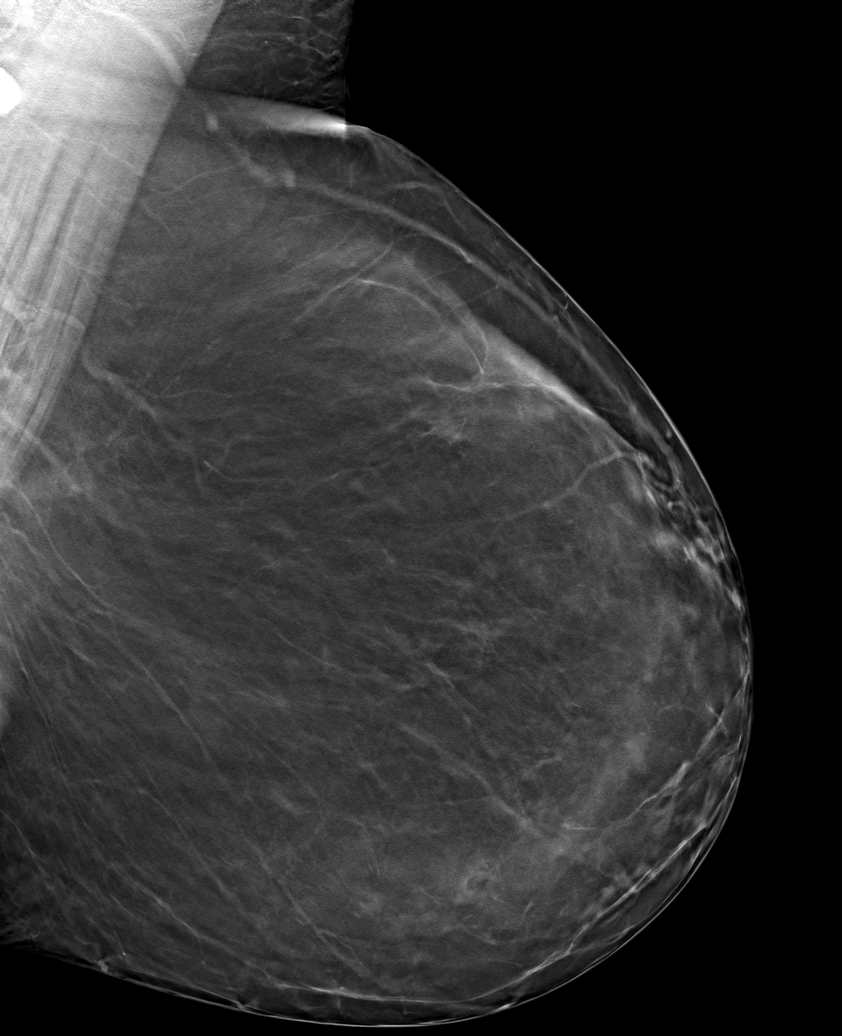

[6 of 30 positions shown; findings below may reference images not displayed]

ACR Breast Density Category b: There are scattered areas of
fibroglandular density.
FINDINGS: There are no findings suspicious for malignancy. Images were
processed with CAD.
IMPRESSION: No mammographic evidence of malignancy. A result letter of this
screening mammogram will be mailed directly to the patient.

RECOMMENDATION:
Screening mammogram in one year. (Code:[id number])

BI-RADS CATEGORY  1: Negative.

## 2021-01-31 ENCOUNTER — Telehealth: Payer: Self-pay | Admitting: Pharmacy Technician

## 2021-01-31 NOTE — Telephone Encounter (Signed)
Patient failed to provide 2022 proof of income.  No additional medication assistance will be provided by [facility] without the required proof of income documentation.  Patient notified by letter.  [name] Care Manager [facility]    [address] [place], [place]  [address]   January 31, 2021    Crystal Hays [address] [place], [place]  [address]  Dear Crystal Hays:  This is to inform you that you are no longer eligible to receive medication assistance at [facility].  The reason(s) are:    _____Your total gross monthly household income exceeds 250% of the Federal Poverty Level.   _____Tangible assets (savings, checking, stocks/bonds, pension, retirement, etc.) exceeds our             limit  _____You are eligible to receive benefits from [facility], [facility] or HIV Medication              Assistance Program _____You are eligible to receive benefits from a Medicare Part D plan _____You have prescription insurance  _____You are not an [facility] resident __X__Failure to provide all requested documentation.  Still need to provide paystubs, bank statement and a bill.  Medication assistance will resume once all requested documentation has been returned to our clinic.  If you have questions, please contact our clinic at [phone].    Thank you,  [facility]

## 2021-02-05 ENCOUNTER — Other Ambulatory Visit: Payer: Self-pay

## 2021-06-21 ENCOUNTER — Other Ambulatory Visit: Payer: Self-pay

## 2021-08-26 ENCOUNTER — Other Ambulatory Visit: Payer: Self-pay | Admitting: Gerontology

## 2021-08-26 DIAGNOSIS — I10 Essential (primary) hypertension: Secondary | ICD-10-CM

## 2021-11-28 ENCOUNTER — Encounter: Payer: Self-pay | Admitting: Nurse Practitioner

## 2021-11-28 ENCOUNTER — Ambulatory Visit: Payer: Managed Care, Other (non HMO) | Admitting: Nurse Practitioner

## 2021-11-28 VITALS — BP 162/88 | HR 83 | Temp 95.8°F | Resp 16 | Ht 64.0 in | Wt 260.0 lb

## 2021-11-28 DIAGNOSIS — Z1231 Encounter for screening mammogram for malignant neoplasm of breast: Secondary | ICD-10-CM

## 2021-11-28 DIAGNOSIS — Z760 Encounter for issue of repeat prescription: Secondary | ICD-10-CM

## 2021-11-28 DIAGNOSIS — E559 Vitamin D deficiency, unspecified: Secondary | ICD-10-CM

## 2021-11-28 DIAGNOSIS — R7303 Prediabetes: Secondary | ICD-10-CM | POA: Diagnosis not present

## 2021-11-28 DIAGNOSIS — Z87898 Personal history of other specified conditions: Secondary | ICD-10-CM

## 2021-11-28 DIAGNOSIS — Z7689 Persons encountering health services in other specified circumstances: Secondary | ICD-10-CM

## 2021-11-28 DIAGNOSIS — E782 Mixed hyperlipidemia: Secondary | ICD-10-CM | POA: Diagnosis not present

## 2021-11-28 DIAGNOSIS — I10 Essential (primary) hypertension: Secondary | ICD-10-CM | POA: Diagnosis not present

## 2021-11-28 DIAGNOSIS — L209 Atopic dermatitis, unspecified: Secondary | ICD-10-CM

## 2021-11-28 LAB — POCT GLYCOSYLATED HEMOGLOBIN (HGB A1C): Hemoglobin A1C: 5.8 % — AB (ref 4.0–5.6)

## 2021-11-28 MED ORDER — ALBUTEROL SULFATE HFA 108 (90 BASE) MCG/ACT IN AERS: INHALATION_SPRAY | RESPIRATORY_TRACT | 3 refills | Status: AC

## 2021-11-28 MED ORDER — TRIAMCINOLONE ACETONIDE 0.025 % EX CREA: 1.0000 | TOPICAL_CREAM | CUTANEOUS | 1 refills | Status: AC | PRN

## 2021-11-28 MED ORDER — HYDROCHLOROTHIAZIDE 25 MG PO TABS: ORAL_TABLET | Freq: Every day | ORAL | 2 refills | Status: AC

## 2021-11-28 NOTE — Progress Notes (Signed)
[facility] [place], [place] [address]  Internal MEDICINE  Office Visit Note  Patient Name: Crystal Hays  [id number]  [id number]  Date of Service: 11/28/2021   Complaints/HPI Pt is here for establishment of PCP. Chief Complaint  Patient presents with   New Patient (Initial Visit)    Establish care, prediabetes, high cholesterol and BP   HPI Amanda presents for a new patient visit to establish care.  Well-appearing 61 year old female with prediabetes, high cholesterol and hypertension.  Colonoscopy done 05/23/2019-- good for 5 years.   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  --eats healthy and takes herbal supplements, exercises by walking and dancing with her grandkids.  --former smoker, quit in 1505, denies illicit drug use and only drinks alcohol occasionally, may be once a month or less. Pap normal due in 2025 Drinks Ginger, turmeric, cayenne pepper and lemon tea.  --takes Elderberry, vitamin c and D3 Due for annual physical and labs.  5.8 A1c prediabetic   Current Medication: Outpatient Encounter Medications as of 11/28/2021  Medication Sig   albuterol (VENTOLIN HFA) 108 (90 Base) MCG/ACT inhaler INHALE 1 OR 2 PUFFS INTO THE LUNGS EVERY 6 HOURS AS NEEDED FOR WHEEZING OR SHORT OF BREATH   Ascorbic Acid (VITAMIN C) 100 MG tablet Take 100 mg by mouth daily.   BLACK ELDERBERRY PO Take by mouth.   calcium-vitamin D (OSCAL WITH D) 500-200 MG-UNIT tablet Take 1 tablet by mouth.   hydrochlorothiazide (HYDRODIURIL) 25 MG tablet TAKE ONE TABLET BY MOUTH EVERY DAY   triamcinolone (KENALOG) 0.025 % cream Apply 1 Application topically as needed.   [DISCONTINUED] albuterol (VENTOLIN HFA) 108 (90 Base) MCG/ACT inhaler INHALE 1 OR 2 PUFFS INTO THE LUNGS EVERY 6 HOURS AS NEEDED FOR WHEEZING OR SHORT OF BREATH   [DISCONTINUED] aspirin EC 81 MG tablet Take 1 tablet (81 mg total) by mouth every other day.    [DISCONTINUED] hydrochlorothiazide (HYDRODIURIL) 25 MG tablet TAKE ONE TABLET BY MOUTH EVERY DAY   [DISCONTINUED] triamcinolone (KENALOG) 0.025 % cream Apply 1 application topically as needed.   No facility-administered encounter medications on file as of 11/28/2021.    Surgical History: Past Surgical History:  Procedure Laterality Date   CESAREAN SECTION     COLONOSCOPY WITH PROPOFOL     COLONOSCOPY WITH PROPOFOL N/A 05/23/2019   Procedure: COLONOSCOPY WITH PROPOFOL;  Surgeon: Jonathon Bellows, MD;  Location: [facility] ENDOSCOPY;  Service: Gastroenterology;  Laterality: N/A;  PRIORITY 4   CYST EXCISION      Medical History: Past Medical History:  Diagnosis Date   Allergy    Asthma    Hypertension    Mixed hyperlipidemia 01/01/2022    Family History: Family History  Problem Relation Age of Onset   Heart disease Father    Prostate cancer Father    Hypertension Sister    Diabetes Maternal Aunt    Breast cancer Neg Hx     Social History   Socioeconomic History   Marital status: Divorced    Spouse name: Not on file   Number of children: 5   Years of education: Not on file   Highest education level: High school graduate  Occupational History   Not on file  Tobacco Use   Smoking status: Former    Types: Cigarettes    Quit date: 2004    Years since quitting: 19.9   Smokeless tobacco: Never   Tobacco comments:  quit  Vaping Use   Vaping Use: Never used  Substance and Sexual Activity   Alcohol use: Not Currently   Drug use: No   Sexual activity: Not Currently  Other Topics Concern   Not on file  Social History Narrative   Lives with son and is well taken care of      Asked about a braces program so I told her about dental and she had already been seen there.      --After I explained the program, patient said she did not need anything from it at this time and did not want to participate. No consent form signed.    Social Determinants of Health   Financial Resource  Strain: Medium Risk (07/27/2019)   Overall Financial Resource Strain (CARDIA)    Difficulty of Paying Living Expenses: Somewhat hard  Food Insecurity: No Food Insecurity (07/27/2019)   Hunger Vital Sign    Worried About Running Out of Food in the Last Year: Never true    Ran Out of Food in the Last Year: Never true  Transportation Needs: No Transportation Needs (07/27/2019)   PRAPARE - Hydrologist (Medical): No    Lack of Transportation (Non-Medical): No  Physical Activity: Insufficiently Active (07/27/2019)   Exercise Vital Sign    Days of Exercise per Week: 7 days    Minutes of Exercise per Session: 20 min  Stress: No Stress Concern Present (08/12/2017)   [place]    Feeling of Stress : Not at all  Social Connections: Moderately Integrated (07/27/2019)   Social Connection and Isolation Panel [NHANES]    Frequency of Communication with Friends and Family: More than three times a week    Frequency of Social Gatherings with Friends and Family: More than three times a week    Attends Religious Services: 1 to 4 times per year    Active Member of [employer] or Organizations: Yes    Attends Archivist Meetings: 1 to 4 times per year    Marital Status: Separated  Intimate Partner Violence: Not At Risk (07/27/2019)   Humiliation, Afraid, Rape, and Kick questionnaire    Fear of Current or Ex-Partner: No    Emotionally Abused: No    Physically Abused: No    Sexually Abused: No     Review of Systems  Constitutional:  Negative for chills, fatigue and unexpected weight change.  HENT:  Positive for postnasal drip. Negative for congestion, rhinorrhea, sneezing and sore throat.   Eyes:  Negative for redness.  Respiratory: Negative.  Negative for cough, chest tightness and shortness of breath.   Cardiovascular: Negative.  Negative for chest pain and palpitations.  Gastrointestinal:  Negative for  abdominal pain, constipation, diarrhea, nausea and vomiting.  Genitourinary:  Negative for dysuria and frequency.  Musculoskeletal:  Negative for arthralgias, back pain, joint swelling and neck pain.  Skin:  Negative for rash.  Neurological: Negative.  Negative for tremors and numbness.  Hematological:  Negative for adenopathy. Does not bruise/bleed easily.  Psychiatric/Behavioral:  Negative for behavioral problems (Depression), sleep disturbance and suicidal ideas. The patient is not nervous/anxious.     Vital Signs: BP (!) 162/88   Pulse 83   Temp (!) 95.8 F (35.4 C)   Resp 16   Ht $R'5\' 4"'Jf$  (1.626 m)   Wt 260 lb (117.9 kg)   LMP 09/18/2010 (Approximate)   SpO2 96%   BMI 44.63 kg/m  Physical Exam Vitals reviewed.  Constitutional:      General: She is not in acute distress.    Appearance: Normal appearance. She is obese. She is not ill-appearing.  HENT:     Head: Normocephalic and atraumatic.  Eyes:     Pupils: Pupils are equal, round, and reactive to light.  Cardiovascular:     Rate and Rhythm: Normal rate and regular rhythm.  Pulmonary:     Effort: Pulmonary effort is normal. No respiratory distress.  Neurological:     Mental Status: She is alert and oriented to person, place, and time.  Psychiatric:        Mood and Affect: Mood normal.        Behavior: Behavior normal.       Assessment/Plan: 1. Prediabetes A1c 5.8 today, additional routine labs ordered. Discussed diet and lifestyle modifications. - POCT glycosylated hemoglobin (Hb A1C) - CBC with Differential/Platelet - CMP14+EGFR - Lipid Profile - TSH + free T4  2. Essential hypertension Reordered HCTZ, take as prescribed, routine labs ordered, will review results at next visit - CBC with Differential/Platelet - CMP14+EGFR - Lipid Profile - TSH + free T4  3. Mixed hyperlipidemia Routine labs ordered - CBC with Differential/Platelet - CMP14+EGFR - Lipid Profile - TSH + free T4  4. Vitamin D  deficiency Routine lab ordered - Vitamin D (25 hydroxy)  5. Encounter for screening mammogram for malignant neoplasm of breast Routine mammogram ordered - MM 3D SCREEN BREAST BILATERAL; Future  6. Medication refill - hydrochlorothiazide (HYDRODIURIL) 25 MG tablet; TAKE ONE TABLET BY MOUTH EVERY DAY  Dispense: 90 tablet; Refill: 2 - albuterol (VENTOLIN HFA) 108 (90 Base) MCG/ACT inhaler; INHALE 1 OR 2 PUFFS INTO THE LUNGS EVERY 6 HOURS AS NEEDED FOR WHEEZING OR SHORT OF BREATH  Dispense: 6.7 g; Refill: 3 - triamcinolone (KENALOG) 0.025 % cream; Apply 1 Application topically as needed.  Dispense: 30 g; Refill: 1   General Counseling: Mckinze verbalizes understanding of the findings of todays visit and agrees with plan of treatment. I have discussed any further diagnostic evaluation that may be needed or ordered today. We also reviewed her medications today. she has been encouraged to call the office with any questions or concerns that should arise related to todays visit.    Counseling:  [place] Controlled Substance Database was reviewed by me.  Orders Placed This Encounter  Procedures   MM 3D SCREEN BREAST BILATERAL   CBC with Differential/Platelet   CMP14+EGFR   Lipid Profile   TSH + free T4   Vitamin D (25 hydroxy)   POCT glycosylated hemoglobin (Hb A1C)    Meds ordered this encounter  Medications   hydrochlorothiazide (HYDRODIURIL) 25 MG tablet    Sig: TAKE ONE TABLET BY MOUTH EVERY DAY    Dispense:  90 tablet    Refill:  2   albuterol (VENTOLIN HFA) 108 (90 Base) MCG/ACT inhaler    Sig: INHALE 1 OR 2 PUFFS INTO THE LUNGS EVERY 6 HOURS AS NEEDED FOR WHEEZING OR SHORT OF BREATH    Dispense:  6.7 g    Refill:  3   triamcinolone (KENALOG) 0.025 % cream    Sig: Apply 1 Application topically as needed.    Dispense:  30 g    Refill:  1    Return for CPE, Liticia Gasior PCP earliest available opening. .  Time spent:30 Minutes Time spent with patient included reviewing progress notes,  labs, imaging studies, and discussing plan for follow up.   [place]  Controlled Substance Database was reviewed by me for overdose risk score (ORS)   This patient was seen by Jonetta Osgood, FNP-C in collaboration with Dr. Clayborn Bigness as a part of collaborative care agreement.    Barnell Shieh R. Valetta Fuller, MSN, FNP-C Internal Medicine

## 2021-12-06 ENCOUNTER — Other Ambulatory Visit: Payer: Self-pay

## 2021-12-06 ENCOUNTER — Telehealth: Payer: Self-pay

## 2021-12-06 DIAGNOSIS — J4520 Mild intermittent asthma, uncomplicated: Secondary | ICD-10-CM

## 2021-12-18 MED ORDER — IPRATROPIUM-ALBUTEROL 0.5-2.5 (3) MG/3ML IN SOLN: 3.0000 mL | Freq: Four times a day (QID) | RESPIRATORY_TRACT | 1 refills | Status: AC | PRN

## 2021-12-18 NOTE — Telephone Encounter (Signed)
Neb soln ordered

## 2021-12-24 LAB — CBC WITH DIFFERENTIAL/PLATELET
Basophils Absolute: 0 10*3/uL (ref 0.0–0.2)
Basos: 1 %
EOS (ABSOLUTE): 1 10*3/uL — ABNORMAL HIGH (ref 0.0–0.4)
Eos: 19 %
Hematocrit: 44.5 % (ref 34.0–46.6)
Hemoglobin: 14.3 g/dL (ref 11.1–15.9)
Immature Grans (Abs): 0 10*3/uL (ref 0.0–0.1)
Immature Granulocytes: 0 %
Lymphocytes Absolute: 1.5 10*3/uL (ref 0.7–3.1)
Lymphs: 27 %
MCH: 27.9 pg (ref 26.6–33.0)
MCHC: 32.1 g/dL (ref 31.5–35.7)
MCV: 87 fL (ref 79–97)
Monocytes Absolute: 0.4 10*3/uL (ref 0.1–0.9)
Monocytes: 8 %
Neutrophils Absolute: 2.4 10*3/uL (ref 1.4–7.0)
Neutrophils: 45 %
Platelets: 267 10*3/uL (ref 150–450)
RBC: 5.13 x10E6/uL (ref 3.77–5.28)
RDW: 12.9 % (ref 11.7–15.4)
WBC: 5.4 10*3/uL (ref 3.4–10.8)

## 2021-12-24 LAB — CMP14+EGFR
ALT: 16 IU/L (ref 0–32)
AST: 15 IU/L (ref 0–40)
Albumin/Globulin Ratio: 1.3 (ref 1.2–2.2)
Albumin: 4.2 g/dL (ref 3.9–4.9)
Alkaline Phosphatase: 67 IU/L (ref 44–121)
BUN/Creatinine Ratio: 12 (ref 12–28)
BUN: 8 mg/dL (ref 8–27)
Bilirubin Total: 0.3 mg/dL (ref 0.0–1.2)
CO2: 29 mmol/L (ref 20–29)
Calcium: 9.3 mg/dL (ref 8.7–10.3)
Chloride: 98 mmol/L (ref 96–106)
Creatinine, Ser: 0.67 mg/dL (ref 0.57–1.00)
Globulin, Total: 3.3 g/dL (ref 1.5–4.5)
Glucose: 95 mg/dL (ref 70–99)
Potassium: 4.2 mmol/L (ref 3.5–5.2)
Sodium: 140 mmol/L (ref 134–144)
Total Protein: 7.5 g/dL (ref 6.0–8.5)
eGFR: 99 mL/min/{1.73_m2} (ref >59)

## 2021-12-24 LAB — VITAMIN D 25 HYDROXY (VIT D DEFICIENCY, FRACTURES): Vit D, 25-Hydroxy: 35.8 ng/mL (ref 30.0–100.0)

## 2021-12-24 LAB — LIPID PANEL
Chol/HDL Ratio: 2.4 ratio (ref 0.0–4.4)
Cholesterol, Total: 191 mg/dL (ref 100–199)
HDL: 79 mg/dL (ref >39)
LDL Chol Calc (NIH): 98 mg/dL (ref 0–99)
Triglycerides: 75 mg/dL (ref 0–149)
VLDL Cholesterol Cal: 14 mg/dL (ref 5–40)

## 2021-12-24 LAB — TSH+FREE T4
Free T4: 1.02 ng/dL (ref 0.82–1.77)
TSH: 0.748 u[IU]/mL (ref 0.450–4.500)

## 2021-12-26 ENCOUNTER — Ambulatory Visit
Admission: RE | Admit: 2021-12-26 | Discharge: 2021-12-26 | Disposition: A | Discharge status: Home / Self Care | Payer: Managed Care, Other (non HMO) | Source: Ambulatory Visit | Attending: Nurse Practitioner | Admitting: Nurse Practitioner

## 2021-12-26 DIAGNOSIS — Z1231 Encounter for screening mammogram for malignant neoplasm of breast: Secondary | ICD-10-CM | POA: Diagnosis present

## 2022-01-01 ENCOUNTER — Ambulatory Visit (INDEPENDENT_AMBULATORY_CARE_PROVIDER_SITE_OTHER): Payer: Managed Care, Other (non HMO) | Admitting: Nurse Practitioner

## 2022-01-01 ENCOUNTER — Encounter: Payer: Self-pay | Admitting: Nurse Practitioner

## 2022-01-01 VITALS — BP 136/68 | HR 92 | Temp 96.4°F | Resp 16 | Ht 64.0 in | Wt 258.0 lb

## 2022-01-01 DIAGNOSIS — E782 Mixed hyperlipidemia: Secondary | ICD-10-CM | POA: Diagnosis not present

## 2022-01-01 DIAGNOSIS — Z0001 Encounter for general adult medical examination with abnormal findings: Secondary | ICD-10-CM | POA: Diagnosis not present

## 2022-01-01 DIAGNOSIS — R7303 Prediabetes: Secondary | ICD-10-CM | POA: Diagnosis not present

## 2022-01-01 HISTORY — DX: Mixed hyperlipidemia: E78.2

## 2022-01-01 NOTE — Progress Notes (Signed)
[facility] [address] [place], [place] [address]  Internal MEDICINE  Office Visit Note  Patient Name: Crystal Hays  [id number]  [id number]  Date of Service: 01/01/2022  Chief Complaint  Patient presents with   Annual Exam   Hypertension    HPI Abriel presents for an annual well visit and physical exam.  Well-appearing 61 year old female with prediabetes, high cholesterol and hypertension.  Labs were drawn prior to office visit and discussed today.  --CBC --elevated eosinophils absolute. But all other labs are normal Cholesterol returned to normal.  No new or worsening pain No other concerns.  Colonoscopy done 05/23/2019-- good for 5 years.  Mammogram was negative.     Current Medication: Outpatient Encounter Medications as of 01/01/2022  Medication Sig   albuterol (VENTOLIN HFA) 108 (90 Base) MCG/ACT inhaler INHALE 1 OR 2 PUFFS INTO THE LUNGS EVERY 6 HOURS AS NEEDED FOR WHEEZING OR SHORT OF BREATH   Ascorbic Acid (VITAMIN C) 100 MG tablet Take 100 mg by mouth daily.   BLACK ELDERBERRY PO Take by mouth.   calcium-vitamin D (OSCAL WITH D) 500-200 MG-UNIT tablet Take 1 tablet by mouth.   hydrochlorothiazide (HYDRODIURIL) 25 MG tablet TAKE ONE TABLET BY MOUTH EVERY DAY   ipratropium-albuterol (DUONEB) 0.5-2.5 (3) MG/3ML SOLN Take 3 mLs by nebulization every 6 (six) hours as needed (SOB/wheezing).   triamcinolone (KENALOG) 0.025 % cream Apply 1 Application topically as needed.   No facility-administered encounter medications on file as of 01/01/2022.    Surgical History: Past Surgical History:  Procedure Laterality Date   CESAREAN SECTION     COLONOSCOPY WITH PROPOFOL     COLONOSCOPY WITH PROPOFOL N/A 05/23/2019   Procedure: COLONOSCOPY WITH PROPOFOL;  Surgeon: Wyline Mood, MD;  Location: [facility] ENDOSCOPY;  Service: Gastroenterology;  Laterality: N/A;  PRIORITY 4   CYST EXCISION      Medical History: Past Medical History:  Diagnosis Date   Allergy     Asthma    Hypertension     Family History: Family History  Problem Relation Age of Onset   Heart disease Father    Prostate cancer Father    Hypertension Sister    Diabetes Maternal Aunt    Breast cancer Neg Hx     Social History   Socioeconomic History   Marital status: Divorced    Spouse name: Not on file   Number of children: 5   Years of education: Not on file   Highest education level: High school graduate  Occupational History   Not on file  Tobacco Use   Smoking status: Former    Types: Cigarettes    Quit date: 2004    Years since quitting: 19.8   Smokeless tobacco: Never   Tobacco comments:    quit  Vaping Use   Vaping Use: Never used  Substance and Sexual Activity   Alcohol use: Not Currently   Drug use: No   Sexual activity: Not Currently  Other Topics Concern   Not on file  Social History Narrative   Lives with son and is well taken care of      Asked about a braces program so I told her about dental and she had already been seen there.      --After I explained the program, patient said she did not need anything from it at this time and did not want to participate. No consent form signed.    Social Determinants of Corporate investment banker  Strain: Medium Risk (07/27/2019)   Overall Financial Resource Strain (CARDIA)    Difficulty of Paying Living Expenses: Somewhat hard  Food Insecurity: No Food Insecurity (07/27/2019)   Hunger Vital Sign    Worried About Running Out of Food in the Last Year: Never true    Ran Out of Food in the Last Year: Never true  Transportation Needs: No Transportation Needs (07/27/2019)   PRAPARE - Administrator, Civil Service (Medical): No    Lack of Transportation (Non-Medical): No  Physical Activity: Insufficiently Active (07/27/2019)   Exercise Vital Sign    Days of Exercise per Week: 7 days    Minutes of Exercise per Session: 20 min  Stress: No Stress Concern Present (08/12/2017)   [employer] of  Occupational Health - Occupational Stress Questionnaire    Feeling of Stress : Not at all  Social Connections: Moderately Integrated (07/27/2019)   Social Connection and Isolation Panel [NHANES]    Frequency of Communication with Friends and Family: More than three times a week    Frequency of Social Gatherings with Friends and Family: More than three times a week    Attends Religious Services: 1 to 4 times per year    Active Member of [employer] or Organizations: Yes    Attends Banker Meetings: 1 to 4 times per year    Marital Status: Separated  Intimate Partner Violence: Not At Risk (07/27/2019)   Humiliation, Afraid, Rape, and Kick questionnaire    Fear of Current or Ex-Partner: No    Emotionally Abused: No    Physically Abused: No    Sexually Abused: No      Review of Systems  Constitutional:  Negative for activity change, appetite change, chills, fatigue, fever and unexpected weight change.  HENT: Negative.  Negative for congestion, ear pain, rhinorrhea, sore throat and trouble swallowing.   Eyes: Negative.   Respiratory: Negative.  Negative for cough, chest tightness, shortness of breath and wheezing.   Cardiovascular: Negative.  Negative for chest pain.  Gastrointestinal: Negative.  Negative for abdominal pain, blood in stool, constipation, diarrhea, nausea and vomiting.  Endocrine: Negative.   Genitourinary: Negative.  Negative for difficulty urinating, dysuria, frequency, hematuria and urgency.  Musculoskeletal: Negative.  Negative for arthralgias, back pain, joint swelling, myalgias and neck pain.  Skin: Negative.  Negative for rash and wound.  Allergic/Immunologic: Negative.  Negative for immunocompromised state.  Neurological: Negative.  Negative for dizziness, seizures, numbness and headaches.  Hematological: Negative.   Psychiatric/Behavioral: Negative.  Negative for behavioral problems, self-injury and suicidal ideas. The patient is not nervous/anxious.      Vital Signs: BP 136/68 Comment: 163/93  Pulse 92   Temp (!) 96.4 F (35.8 C)   Resp 16   Ht 5\' 4"  (1.626 m)   Wt 258 lb (117 kg)   LMP 09/18/2010 (Approximate)   SpO2 98%   BMI 44.29 kg/m    Physical Exam Vitals reviewed.  Constitutional:      General: She is not in acute distress.    Appearance: Normal appearance. She is well-developed. She is not diaphoretic.  HENT:     Head: Normocephalic and atraumatic.     Right Ear: External ear normal.     Left Ear: External ear normal.     Nose: Nose normal.     Mouth/Throat:     Pharynx: No oropharyngeal exudate.  Eyes:     General: No scleral icterus.       Right eye: No  discharge.        Left eye: No discharge.     Conjunctiva/sclera: Conjunctivae normal.     Pupils: Pupils are equal, round, and reactive to light.  Neck:     Thyroid: No thyromegaly.     Vascular: No JVD.     Trachea: No tracheal deviation.  Cardiovascular:     Rate and Rhythm: Normal rate and regular rhythm.     Heart sounds: Normal heart sounds. No murmur heard.    No friction rub. No gallop.  Pulmonary:     Effort: Pulmonary effort is normal. No respiratory distress.     Breath sounds: Normal breath sounds. No stridor. No wheezing or rales.  Chest:     Chest wall: No tenderness.  Abdominal:     General: Bowel sounds are normal. There is no distension.     Palpations: Abdomen is soft. There is no mass.     Tenderness: There is no abdominal tenderness. There is no guarding or rebound.  Musculoskeletal:        General: No tenderness or deformity. Normal range of motion.     Cervical back: Normal range of motion and neck supple.  Lymphadenopathy:     Cervical: No cervical adenopathy.  Skin:    General: Skin is warm and dry.     Coloration: Skin is not pale.     Findings: No erythema or rash.  Neurological:     Mental Status: She is alert.     Cranial Nerves: No cranial nerve deficit.     Motor: No abnormal muscle tone.     Coordination:  Coordination normal.     Deep Tendon Reflexes: Reflexes are normal and symmetric.  Psychiatric:        Behavior: Behavior normal.        Thought Content: Thought content normal.        Judgment: Judgment normal.        Assessment/Plan: 1. Encounter for routine adult health examination with abnormal findings Age-appropriate preventive screenings and vaccinations discussed, annual physical exam completed. Routine labs for health maintenance ordered (if needed). PHM updated.    2. Mixed hyperlipidemia Cholesterol levels are normal, continue diet modifications as discussed  3. Prediabetes Repeat A1c in 3 months, continue current diet modifications     General Counseling: Senetra verbalizes understanding of the findings of todays visit and agrees with plan of treatment. I have discussed any further diagnostic evaluation that may be needed or ordered today. We also reviewed her medications today. she has been encouraged to call the office with any questions or concerns that should arise related to todays visit.    No orders of the defined types were placed in this encounter.   No orders of the defined types were placed in this encounter.   Return in about 3 months (around 04/03/2022) for F/U, Recheck A1C, Bereket Gernert PCP.   Total time spent:30 Minutes Time spent includes review of chart, medications, test results, and follow up plan with the patient.   [place] Controlled Substance Database was reviewed by me.  This patient was seen by Sallyanne Kuster, FNP-C in collaboration with Dr. Beverely Risen as a part of collaborative care agreement.  Cotton Beckley R. Tedd Sias, MSN, FNP-C Internal medicine

## 2022-01-12 ENCOUNTER — Encounter: Payer: Self-pay | Admitting: Nurse Practitioner

## 2022-04-02 ENCOUNTER — Ambulatory Visit: Payer: Managed Care, Other (non HMO) | Admitting: Nurse Practitioner

## 2022-06-15 ENCOUNTER — Other Ambulatory Visit: Payer: Self-pay | Admitting: Nurse Practitioner

## 2022-06-15 DIAGNOSIS — Z760 Encounter for issue of repeat prescription: Secondary | ICD-10-CM

## 2023-01-07 ENCOUNTER — Encounter: Payer: Managed Care, Other (non HMO) | Admitting: Nurse Practitioner
